# Patient Record
Sex: Male | Born: 1960 | Race: Black or African American | Hispanic: No | Smoking: Never smoker
Health system: Southern US, Community
[De-identification: ages and names within clinical notes are randomized; demographics above are authoritative.]

## PROBLEM LIST (undated history)

## (undated) DIAGNOSIS — I10 Essential (primary) hypertension: Secondary | ICD-10-CM

---

## 2000-12-02 ENCOUNTER — Encounter: Payer: Self-pay | Admitting: Pulmonary Disease

## 2000-12-02 ENCOUNTER — Ambulatory Visit (HOSPITAL_COMMUNITY): Admission: RE | Admit: 2000-12-02 | Discharge: 2000-12-02 | Payer: Self-pay | Admitting: Pulmonary Disease

## 2001-05-18 ENCOUNTER — Emergency Department (HOSPITAL_COMMUNITY): Admission: EM | Admit: 2001-05-18 | Discharge: 2001-05-18 | Payer: Self-pay | Admitting: Emergency Medicine

## 2001-05-27 ENCOUNTER — Encounter: Admission: RE | Admit: 2001-05-27 | Discharge: 2001-06-13 | Payer: Self-pay | Admitting: Family Medicine

## 2012-09-26 ENCOUNTER — Emergency Department (HOSPITAL_COMMUNITY)
Admission: EM | Admit: 2012-09-26 | Discharge: 2012-09-26 | Disposition: A | Discharge status: Home / Self Care | Payer: BC Managed Care – PPO | Attending: Emergency Medicine | Admitting: Emergency Medicine

## 2012-09-26 ENCOUNTER — Encounter (HOSPITAL_COMMUNITY): Payer: Self-pay | Admitting: Emergency Medicine

## 2012-09-26 ENCOUNTER — Emergency Department (HOSPITAL_COMMUNITY): Payer: BC Managed Care – PPO

## 2012-09-26 DIAGNOSIS — Y9389 Activity, other specified: Secondary | ICD-10-CM | POA: Insufficient documentation

## 2012-09-26 DIAGNOSIS — Y9289 Other specified places as the place of occurrence of the external cause: Secondary | ICD-10-CM | POA: Insufficient documentation

## 2012-09-26 DIAGNOSIS — I10 Essential (primary) hypertension: Secondary | ICD-10-CM | POA: Insufficient documentation

## 2012-09-26 DIAGNOSIS — S20219A Contusion of unspecified front wall of thorax, initial encounter: Secondary | ICD-10-CM | POA: Insufficient documentation

## 2012-09-26 HISTORY — DX: Essential (primary) hypertension: I10

## 2012-09-26 MED ORDER — HYDROCODONE-ACETAMINOPHEN 5-325 MG PO TABS: 2.0000 | ORAL_TABLET | ORAL | Status: AC | PRN

## 2012-09-26 MED ORDER — HYDROCODONE-ACETAMINOPHEN 5-325 MG PO TABS
2.0000 | ORAL_TABLET | Freq: Once | ORAL | Status: AC
  Administered 2012-09-26: 2 via ORAL
  Filled 2012-09-26: qty 2

## 2012-09-26 NOTE — ED Provider Notes (Signed)
Medical screening examination/treatment/procedure(s) were performed by non-physician practitioner and as supervising physician I was immediately available for consultation/collaboration.  Leslee Home, M.D.   Reuben Likes, MD 09/26/12 1136

## 2012-09-26 NOTE — ED Notes (Signed)
Pt returned from xray

## 2012-09-26 NOTE — ED Notes (Addendum)
Pt c/o rt rib area pain, and lower rt back and hip pain. Pt also has hx of htn, did not take medication this morning.
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
## 2012-09-26 NOTE — ED Notes (Signed)
Patient transported to X-ray 

## 2012-09-26 NOTE — ED Notes (Signed)
Discharge instructions reviewed. Pt verbalized understanding.  
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
## 2012-09-26 NOTE — ED Notes (Signed)
Pt transported to xray via stretcher

## 2014-02-25 ENCOUNTER — Encounter (HOSPITAL_COMMUNITY): Payer: Self-pay | Admitting: Emergency Medicine

## 2014-02-25 ENCOUNTER — Emergency Department (HOSPITAL_COMMUNITY)
Admission: EM | Admit: 2014-02-25 | Discharge: 2014-02-25 | Disposition: A | Discharge status: Home / Self Care | Payer: BC Managed Care – PPO | Source: Home / Self Care | Attending: Family Medicine | Admitting: Family Medicine

## 2014-02-25 DIAGNOSIS — N182 Chronic kidney disease, stage 2 (mild): Secondary | ICD-10-CM

## 2014-02-25 DIAGNOSIS — I10 Essential (primary) hypertension: Secondary | ICD-10-CM

## 2014-02-25 DIAGNOSIS — J40 Bronchitis, not specified as acute or chronic: Secondary | ICD-10-CM

## 2014-02-25 LAB — POCT I-STAT, CHEM 8
BUN: 12 mg/dL (ref 6–23)
CALCIUM ION: 1.16 mmol/L (ref 1.12–1.23)
CHLORIDE: 105 meq/L (ref 96–112)
Creatinine, Ser: 1.4 mg/dL — ABNORMAL HIGH (ref 0.50–1.35)
Glucose, Bld: 108 mg/dL — ABNORMAL HIGH (ref 70–99)
HCT: 45 % (ref 39.0–52.0)
Hemoglobin: 15.3 g/dL (ref 13.0–17.0)
Potassium: 3.8 mEq/L (ref 3.7–5.3)
Sodium: 142 mEq/L (ref 137–147)
TCO2: 26 mmol/L (ref 0–100)

## 2014-02-25 MED ORDER — FLUTICASONE PROPIONATE 50 MCG/ACT NA SUSP: 2.0000 | Freq: Every day | NASAL | Status: AC

## 2014-02-25 MED ORDER — IPRATROPIUM-ALBUTEROL 0.5-2.5 (3) MG/3ML IN SOLN
3.0000 mL | Freq: Once | RESPIRATORY_TRACT | Status: AC
  Administered 2014-02-25: 3 mL via RESPIRATORY_TRACT

## 2014-02-25 MED ORDER — ALBUTEROL SULFATE HFA 108 (90 BASE) MCG/ACT IN AERS: 2.0000 | INHALATION_SPRAY | Freq: Four times a day (QID) | RESPIRATORY_TRACT | Status: AC | PRN

## 2014-02-25 MED ORDER — IPRATROPIUM-ALBUTEROL 0.5-2.5 (3) MG/3ML IN SOLN
RESPIRATORY_TRACT | Status: AC
  Filled 2014-02-25: qty 3

## 2014-02-25 MED ORDER — PREDNISONE 10 MG PO TABS: 30.0000 mg | ORAL_TABLET | Freq: Every day | ORAL | Status: DC

## 2014-02-25 MED ORDER — OLMESARTAN MEDOXOMIL-HCTZ 20-12.5 MG PO TABS: 1.0000 | ORAL_TABLET | Freq: Every day | ORAL | Status: DC

## 2014-02-25 NOTE — Discharge Instructions (Signed)
Thank you for coming in today. Take the blood pressure medicine.  Follow up with your doctor in 2-3 weeks for kidney function test.  Your creatine is 1.4 (normal is around 1).  Take prednisone daily for 5 days.  Use over the counter zyrtec.  Use flonase nasal spray daily during allergy season.  Use albuterol inhaler as needed.  Call or go to the emergency room if you get worse, have trouble breathing, have chest pains, or palpitations. '   Bronchitis Bronchitis is inflammation of the airways that extend from the windpipe into the lungs (bronchi). The inflammation often causes mucus to develop, which leads to a cough. If the inflammation becomes severe, it may cause shortness of breath. CAUSES  Bronchitis may be caused by:   Viral infections.   Bacteria.   Cigarette smoke.   Allergens, pollutants, and other irritants.  SIGNS AND SYMPTOMS  The most common symptom of bronchitis is a frequent cough that produces mucus. Other symptoms include:  Fever.   Body aches.   Chest congestion.   Chills.   Shortness of breath.   Sore throat.  DIAGNOSIS  Bronchitis is usually diagnosed through a medical history and physical exam. Tests, such as chest X-rays, are sometimes done to rule out other conditions.  TREATMENT  You may need to avoid contact with whatever caused the problem (smoking, for example). Medicines are sometimes needed. These may include:  Antibiotics. These may be prescribed if the condition is caused by bacteria.  Cough suppressants. These may be prescribed for relief of cough symptoms.   Inhaled medicines. These may be prescribed to help open your airways and make it easier for you to breathe.   Steroid medicines. These may be prescribed for those with recurrent (chronic) bronchitis. HOME CARE INSTRUCTIONS  Get plenty of rest.   Drink enough fluids to keep your urine clear or pale yellow (unless you have a medical condition that requires fluid  restriction). Increasing fluids may help thin your secretions and will prevent dehydration.   Only take over-the-counter or prescription medicines as directed by your health care provider.  Only take antibiotics as directed. Make sure you finish them even if you start to feel better.  Avoid secondhand smoke, irritating chemicals, and strong fumes. These will make bronchitis worse. If you are a smoker, quit smoking. Consider using nicotine gum or skin patches to help control withdrawal symptoms. Quitting smoking will help your lungs heal faster.   Put a cool-mist humidifier in your bedroom at night to moisten the air. This may help loosen mucus. Change the water in the humidifier daily. You can also run the hot water in your shower and sit in the bathroom with the door closed for 5 10 minutes.   Follow up with your health care provider as directed.   Wash your hands frequently to avoid catching bronchitis again or spreading an infection to others.  SEEK MEDICAL CARE IF: Your symptoms do not improve after 1 week of treatment.  SEEK IMMEDIATE MEDICAL CARE IF:  Your fever increases.  You have chills.   You have chest pain.   You have worsening shortness of breath.   You have bloody sputum.  You faint.  You have lightheadedness.  You have a severe headache.   You vomit repeatedly. MAKE SURE YOU:   Understand these instructions.  Will watch your condition.  Will get help right away if you are not doing well or get worse. Document Released: 09/24/2005 Document Revised: 07/15/2013 Document Reviewed:  05/19/2013 ExitCare Patient Information 2014 [place], [place].   Arterial Hypertension Arterial hypertension (high blood pressure) is a condition of elevated pressure in your blood vessels. Hypertension over a long period of time is a risk factor for strokes, heart attacks, and heart failure. It is also the leading cause of kidney (renal) failure.  CAUSES   In Adults --  Over 90% of all hypertension has no known cause. This is called essential or primary hypertension. In the other 10% of people with hypertension, the increase in blood pressure is caused by another disorder. This is called secondary hypertension. Important causes of secondary hypertension are:  Heavy alcohol use.  Obstructive sleep apnea.  Hyperaldosterosim (Conn's syndrome).  Steroid use.  Chronic kidney failure.  Hyperparathyroidism.  Medications.  Renal artery stenosis.  Pheochromocytoma.  Cushing's disease.  Coarctation of the aorta.  Scleroderma renal crisis.  Licorice (in excessive amounts).  Drugs (cocaine, methamphetamine). Your caregiver can explain any items above that apply to you.  In Children -- Secondary hypertension is more common and should always be considered.  Pregnancy -- Few women of childbearing age have high blood pressure. However, up to 10% of them develop hypertension of pregnancy. Generally, this will not harm the woman. It may be a sign of 3 complications of pregnancy: preeclampsia, HELLP syndrome, and eclampsia. Follow up and control with medication is necessary. SYMPTOMS   This condition normally does not produce any noticeable symptoms. It is usually found during a routine exam.  Malignant hypertension is a late problem of high blood pressure. It may have the following symptoms:  Headaches.  Blurred vision.  End-organ damage (this means your kidneys, heart, lungs, and other organs are being damaged).  Stressful situations can increase the blood pressure. If a person with normal blood pressure has their blood pressure go up while being seen by their caregiver, this is often termed "white coat hypertension." Its importance is not known. It may be related with eventually developing hypertension or complications of hypertension.  Hypertension is often confused with mental tension, stress, and anxiety. DIAGNOSIS  The diagnosis is made by 3  separate blood pressure measurements. They are taken at least 1 week apart from each other. If there is organ damage from hypertension, the diagnosis may be made without repeat measurements. Hypertension is usually identified by having blood pressure readings:  Above 140/90 mmHg measured in both arms, at 3 separate times, over a couple weeks.  Over 130/80 mmHg should be considered a risk factor and may require treatment in patients with diabetes. Blood pressure readings over 120/80 mmHg are called "pre-hypertension" even in non-diabetic patients. To get a true blood pressure measurement, use the following guidelines. Be aware of the factors that can alter blood pressure readings.  Take measurements at least 1 hour after caffeine.  Take measurements 30 minutes after smoking and without any stress. This is another reason to quit smoking  it raises your blood pressure.  Use a proper cuff size. Ask your caregiver if you are not sure about your cuff size.  Most home blood pressure cuffs are automatic. They will measure systolic and diastolic pressures. The systolic pressure is the pressure reading at the start of sounds. Diastolic pressure is the pressure at which the sounds disappear. If you are elderly, measure pressures in multiple postures. Try sitting, lying or standing.  Sit at rest for a minimum of 5 minutes before taking measurements.  You should not be on any medications like decongestants. These are found in many  cold medications.  Record your blood pressure readings and review them with your caregiver. If you have hypertension:  Your caregiver may do tests to be sure you do not have secondary hypertension (see "causes" above).  Your caregiver may also look for signs of metabolic syndrome. This is also called Syndrome X or Insulin Resistance Syndrome. You may have this syndrome if you have type 2 diabetes, abdominal obesity, and abnormal blood lipids in addition to  hypertension.  Your caregiver will take your medical and family history and perform a physical exam.  Diagnostic tests may include blood tests (for glucose, cholesterol, potassium, and kidney function), a urinalysis, or an EKG. Other tests may also be necessary depending on your condition. PREVENTION  There are important lifestyle issues that you can adopt to reduce your chance of developing hypertension:  Maintain a normal weight.  Limit the amount of salt (sodium) in your diet.  Exercise often.  Limit alcohol intake.  Get enough potassium in your diet. Discuss specific advice with your caregiver.  Follow a DASH diet (dietary approaches to stop hypertension). This diet is rich in fruits, vegetables, and low-fat dairy products, and avoids certain fats. PROGNOSIS  Essential hypertension cannot be cured. Lifestyle changes and medical treatment can lower blood pressure and reduce complications. The prognosis of secondary hypertension depends on the underlying cause. Many people whose hypertension is controlled with medicine or lifestyle changes can live a normal, healthy life.  RISKS AND COMPLICATIONS  While high blood pressure alone is not an illness, it often requires treatment due to its short- and long-term effects on many organs. Hypertension increases your risk for:  CVAs or strokes (cerebrovascular accident).  Heart failure due to chronically high blood pressure (hypertensive cardiomyopathy).  Heart attack (myocardial infarction).  Damage to the retina (hypertensive retinopathy).  Kidney failure (hypertensive nephropathy). Your caregiver can explain list items above that apply to you. Treatment of hypertension can significantly reduce the risk of complications. TREATMENT   For overweight patients, weight loss and regular exercise are recommended. Physical fitness lowers blood pressure.  Mild hypertension is usually treated with diet and exercise. A diet rich in fruits and  vegetables, fat-free dairy products, and foods low in fat and salt (sodium) can help lower blood pressure. Decreasing salt intake decreases blood pressure in a 1/3 of people.  Stop smoking if you are a smoker. The steps above are highly effective in reducing blood pressure. While these actions are easy to suggest, they are difficult to achieve. Most patients with moderate or severe hypertension end up requiring medications to bring their blood pressure down to a normal level. There are several classes of medications for treatment. Blood pressure pills (antihypertensives) will lower blood pressure by their different actions. Lowering the blood pressure by 10 mmHg may decrease the risk of complications by as much as 25%. The goal of treatment is effective blood pressure control. This will reduce your risk for complications. Your caregiver will help you determine the best treatment for you according to your lifestyle. What is excellent treatment for one person, may not be for you. HOME CARE INSTRUCTIONS   Do not smoke.  Follow the lifestyle changes outlined in the "Prevention" section.  If you are on medications, follow the directions carefully. Blood pressure medications must be taken as prescribed. Skipping doses reduces their benefit. It also puts you at risk for problems.  Follow up with your caregiver, as directed.  If you are asked to monitor your blood pressure at home, follow  the guidelines in the "Diagnosis" section above. SEEK MEDICAL CARE IF:   You think you are having medication side effects.  You have recurrent headaches or lightheadedness.  You have swelling in your ankles.  You have trouble with your vision. SEEK IMMEDIATE MEDICAL CARE IF:   You have sudden onset of chest pain or pressure, difficulty breathing, or other symptoms of a heart attack.  You have a severe headache.  You have symptoms of a stroke (such as sudden weakness, difficulty speaking, difficulty  walking). MAKE SURE YOU:   Understand these instructions.  Will watch your condition.  Will get help right away if you are not doing well or get worse. Document Released: 09/24/2005 Document Revised: 12/17/2011 Document Reviewed: 04/24/2007 [facility] Patient Information 2014 [place], [place].

## 2014-02-25 NOTE — ED Provider Notes (Signed)
Malik Gonzalez is a 53 y.o. male who presents to Urgent Care today for cough. Patient has one to 2 days of productive cough no associated with wheezing and mild shortness of breath and chest tightness. He additionally has a runny nose. Has tried cold which helps a bit. He works outside is been exposed to allergies.  Additionally he has hypertension. He has run out of his blood pressure medications recently. He denies any current chest pains or palpitations dizziness lightheadedness or weakness.   Past Medical History  Diagnosis Date  . Hypertension    History  Substance Use Topics  . Smoking status: Never Smoker   . Smokeless tobacco: Not on file  . Alcohol Use: No   ROS as above Medications: No current facility-administered medications for this encounter.   Current Outpatient Prescriptions  Medication Sig Dispense Refill  . acetaminophen (TYLENOL) 500 MG tablet Take 1,000 mg by mouth every 6 (six) hours as needed. For pain      . albuterol (PROVENTIL HFA;VENTOLIN HFA) 108 (90 BASE) MCG/ACT inhaler Inhale 2 puffs into the lungs every 6 (six) hours as needed for wheezing or shortness of breath.  1 Inhaler  2  . fluticasone (FLONASE) 50 MCG/ACT nasal spray Place 2 sprays into both nostrils daily.  16 g  2  . Multiple Vitamin (MULTIVITAMIN WITH MINERALS) TABS Take 1 tablet by mouth daily.      Marland Kitchen. olmesartan-hydrochlorothiazide (BENICAR HCT) 20-12.5 MG per tablet Take 1 tablet by mouth daily.  30 tablet  0  . predniSONE (DELTASONE) 10 MG tablet Take 3 tablets (30 mg total) by mouth daily.  15 tablet  0    Exam:  BP 153/100  Pulse 68  Temp(Src) 98.3 F (36.8 C) (Oral)  Resp 18  SpO2 100% Gen: Well NAD HEENT: EOMI,  MMM posterior pharynx cobblestoning. Normal tympanic membranes bilaterally. Clear nasal discharge present. Lungs: Normal work of breathing. Wheezing present bilaterally. Heart: RRR no MRG Abd: NABS, Soft. NT, ND Exts: Brisk capillary refill, warm and well perfused.    She was given a DuoNeb nebulizer treatment, and felt much better  Results for orders placed during the hospital encounter of 02/25/14 (from the past 24 hour(s))  POCT I-STAT, CHEM 8     Status: Abnormal   Collection Time    02/25/14  9:12 AM      Result Value Ref Range   Sodium 142  137 - 147 mEq/L   Potassium 3.8  3.7 - 5.3 mEq/L   Chloride 105  96 - 112 mEq/L   BUN 12  6 - 23 mg/dL   Creatinine, Ser 6.961.40 (*) 0.50 - 1.35 mg/dL   Glucose, Bld 295108 (*) 70 - 99 mg/dL   Calcium, Ion 2.841.16  1.321.12 - 1.23 mmol/L   TCO2 26  0 - 100 mmol/L   Hemoglobin 15.3  13.0 - 17.0 g/dL   HCT 44.045.0  10.239.0 - 72.552.0 %   No results found.  Assessment and Plan: 53 y.o. male with  1) allergic bronchitis with sinusitis. Plan to treat with prednisone albuterol Flonase and Zyrtec. 2) hypertension: Plan to refill existing Benicar.  3) elevated creatinine: Mild likely chronic. Recommend patient followup with his primary care provider in 2-3 weeks for recheck of creatinine.  Discussed warning signs or symptoms. Please see discharge instructions. Patient expresses understanding.    Rodolph BongEvan S Thaer Miyoshi, MD 02/25/14 [phone]

## 2014-02-25 NOTE — ED Notes (Signed)
C/o uri which started yesterday States he has a cough and sob Tried halls as tx States he does have seasonal allergies

## 2014-03-05 ENCOUNTER — Ambulatory Visit (HOSPITAL_COMMUNITY)
Admission: RE | Admit: 2014-03-05 | Discharge: 2014-03-05 | Disposition: A | Discharge status: Home / Self Care | Payer: BC Managed Care – PPO | Source: Ambulatory Visit | Attending: Pulmonary Disease | Admitting: Pulmonary Disease

## 2014-03-05 ENCOUNTER — Other Ambulatory Visit (HOSPITAL_COMMUNITY): Payer: Self-pay | Admitting: Pulmonary Disease

## 2014-03-05 DIAGNOSIS — R05 Cough: Secondary | ICD-10-CM | POA: Insufficient documentation

## 2014-03-05 DIAGNOSIS — R059 Cough, unspecified: Secondary | ICD-10-CM

## 2014-03-05 DIAGNOSIS — J40 Bronchitis, not specified as acute or chronic: Secondary | ICD-10-CM | POA: Diagnosis not present

## 2014-03-05 DIAGNOSIS — J3489 Other specified disorders of nose and nasal sinuses: Secondary | ICD-10-CM | POA: Diagnosis not present

## 2019-06-29 ENCOUNTER — Other Ambulatory Visit: Payer: Self-pay | Admitting: Family Medicine

## 2019-06-29 DIAGNOSIS — N410 Acute prostatitis: Secondary | ICD-10-CM

## 2019-06-29 DIAGNOSIS — N5089 Other specified disorders of the male genital organs: Secondary | ICD-10-CM

## 2019-07-06 ENCOUNTER — Ambulatory Visit
Admission: RE | Admit: 2019-07-06 | Discharge: 2019-07-06 | Disposition: A | Discharge status: Home / Self Care | Payer: BC Managed Care – PPO | Source: Ambulatory Visit | Attending: Family Medicine | Admitting: Family Medicine

## 2019-07-06 DIAGNOSIS — N410 Acute prostatitis: Secondary | ICD-10-CM

## 2019-07-06 DIAGNOSIS — N5089 Other specified disorders of the male genital organs: Secondary | ICD-10-CM

## 2021-03-30 ENCOUNTER — Encounter: Payer: Self-pay | Admitting: Emergency Medicine

## 2021-03-30 ENCOUNTER — Emergency Department
Admission: EM | Admit: 2021-03-30 | Discharge: 2021-03-30 | Disposition: A | Discharge status: Home / Self Care | Payer: BC Managed Care – PPO | Attending: Emergency Medicine | Admitting: Emergency Medicine

## 2021-03-30 ENCOUNTER — Emergency Department: Payer: BC Managed Care – PPO

## 2021-03-30 ENCOUNTER — Other Ambulatory Visit: Payer: Self-pay

## 2021-03-30 DIAGNOSIS — R079 Chest pain, unspecified: Secondary | ICD-10-CM

## 2021-03-30 DIAGNOSIS — R0789 Other chest pain: Secondary | ICD-10-CM | POA: Insufficient documentation

## 2021-03-30 DIAGNOSIS — Z20822 Contact with and (suspected) exposure to covid-19: Secondary | ICD-10-CM | POA: Insufficient documentation

## 2021-03-30 DIAGNOSIS — I10 Essential (primary) hypertension: Secondary | ICD-10-CM | POA: Diagnosis not present

## 2021-03-30 DIAGNOSIS — Z79899 Other long term (current) drug therapy: Secondary | ICD-10-CM | POA: Diagnosis not present

## 2021-03-30 DIAGNOSIS — R202 Paresthesia of skin: Secondary | ICD-10-CM | POA: Insufficient documentation

## 2021-03-30 LAB — COMPREHENSIVE METABOLIC PANEL
ALT: 26 U/L (ref 0–44)
AST: 26 U/L (ref 15–41)
Albumin: 4 g/dL (ref 3.5–5.0)
Alkaline Phosphatase: 75 U/L (ref 38–126)
Anion gap: 5 (ref 5–15)
BUN: 20 mg/dL (ref 6–20)
CO2: 28 mmol/L (ref 22–32)
Calcium: 9 mg/dL (ref 8.9–10.3)
Chloride: 103 mmol/L (ref 98–111)
Creatinine, Ser: 1.33 mg/dL — ABNORMAL HIGH (ref 0.61–1.24)
GFR, Estimated: >60 mL/min (ref >60)
Glucose, Bld: 104 mg/dL — ABNORMAL HIGH (ref 70–99)
Potassium: 3.7 mmol/L (ref 3.5–5.1)
Sodium: 136 mmol/L (ref 135–145)
Total Bilirubin: 1.9 mg/dL — ABNORMAL HIGH (ref 0.3–1.2)
Total Protein: 7.5 g/dL (ref 6.5–8.1)

## 2021-03-30 LAB — CBC
HCT: 41.3 % (ref 39.0–52.0)
Hemoglobin: 13.7 g/dL (ref 13.0–17.0)
MCH: 29.2 pg (ref 26.0–34.0)
MCHC: 33.2 g/dL (ref 30.0–36.0)
MCV: 88.1 fL (ref 80.0–100.0)
Platelets: 196 10*3/uL (ref 150–400)
RBC: 4.69 MIL/uL (ref 4.22–5.81)
RDW: 12.8 % (ref 11.5–15.5)
WBC: 8.3 10*3/uL (ref 4.0–10.5)
nRBC: 0 % (ref 0.0–0.2)

## 2021-03-30 LAB — TROPONIN I (HIGH SENSITIVITY)
Troponin I (High Sensitivity): 5 ng/L (ref <18)
Troponin I (High Sensitivity): 5 ng/L (ref <18)

## 2021-03-30 LAB — RESP PANEL BY RT-PCR (FLU A&B, COVID) ARPGX2
Influenza A by PCR: NEGATIVE
Influenza B by PCR: NEGATIVE
SARS Coronavirus 2 by RT PCR: NEGATIVE

## 2021-03-30 MED ORDER — HYDROCHLOROTHIAZIDE 12.5 MG PO CAPS: 12.5000 mg | ORAL_CAPSULE | Freq: Every day | ORAL | 0 refills | Status: DC

## 2021-03-30 MED ORDER — NITROGLYCERIN 2 % TD OINT
1.0000 [in_us] | TOPICAL_OINTMENT | Freq: Once | TRANSDERMAL | Status: AC
  Administered 2021-03-30: 1 [in_us] via TOPICAL
  Filled 2021-03-30: qty 1

## 2021-03-30 MED ORDER — ACETAMINOPHEN 500 MG PO TABS
1000.0000 mg | ORAL_TABLET | Freq: Once | ORAL | Status: AC
  Administered 2021-03-30: 1000 mg via ORAL
  Filled 2021-03-30: qty 2

## 2021-03-30 NOTE — ED Triage Notes (Signed)
Patient arrives via [facility] from home for CP that started 2 days ago. Patient states it feels like pressure with numbness radiating into left arm. Patient Aox4 at this time.

## 2021-03-30 NOTE — Discharge Instructions (Addendum)
Please call and schedule a follow-up appointment with both cardiology and primary care.  Take the additional hydrochlorothiazide in addition to the medication you are already on.  Return to the emergency department for symptoms change or worsen if you are unable to schedule an appointment.

## 2021-03-30 NOTE — ED Provider Notes (Signed)
[facility] Emergency Department Provider Note  ____________________________________________   Event Date/Time   First MD Initiated Contact with Patient 03/30/21 1324     (approximate)  I have reviewed the triage vital signs and the nursing notes.   HISTORY  Chief Complaint Chest Pain   HPI Malik Gonzalez is a 60 y.o. male with a history of hypertension presents to the emergency department for treatment and evaluation of chest pain with left arm tingling that was intermittent yesterday, then became consistent yesterday evening. Symptoms have continued through the day. He was hypertensive enroute. EMS gave 1 NTG with reduction in blood pressure and chest pain.  HEART score 3.  Past Medical History:  Diagnosis Date   Hypertension     There are no problems to display for this patient.   History reviewed. No pertinent surgical history.  Prior to Admission medications   Medication Sig Start Date End Date Taking? Authorizing Provider  acetaminophen (TYLENOL) 500 MG tablet Take 1,000 mg by mouth every 6 (six) hours as needed. For pain    [provider]  albuterol (PROVENTIL HFA;VENTOLIN HFA) 108 (90 BASE) MCG/ACT inhaler Inhale 2 puffs into the lungs every 6 (six) hours as needed for wheezing or shortness of breath. 02/25/14   Rodolph Bong, MD  fluticasone (FLONASE) 50 MCG/ACT nasal spray Place 2 sprays into both nostrils daily. 02/25/14   Rodolph Bong, MD  Multiple Vitamin (MULTIVITAMIN WITH MINERALS) TABS Take 1 tablet by mouth daily.    [provider]  olmesartan-hydrochlorothiazide (BENICAR HCT) 20-12.5 MG per tablet Take 1 tablet by mouth daily. 02/25/14   Rodolph Bong, MD  predniSONE (DELTASONE) 10 MG tablet Take 3 tablets (30 mg total) by mouth daily. 02/25/14   Rodolph Bong, MD    Allergies Aspirin  No family history on file.  Social History Social History   Tobacco Use   Smoking status: Never  Substance Use Topics    Alcohol use: No   Drug use: No    Review of Systems  Constitutional: No fever/chills. Eyes: No visual changes. ENT: No sore throat. Cardiovascular: Positive for chest pain/pressure. Negative for pleuritic pain. Negative for palpitations. Negative for leg pain. Respiratory: Negative shortness of breath. Gastrointestinal: Negative for abdominal pain. no nausea, no vomiting.  No diarrhea.  No constipation. Genitourinary: Negative for dysuria. Musculoskeletal: Negative for for back pain.  Skin: Negative for rash, lesion, wound. Neurological: Negative for headaches, focal weakness or numbness.  ____________________________________________   PHYSICAL EXAM:  Today's Vitals   03/30/21 1400 03/30/21 1430 03/30/21 1530 03/30/21 1541  BP: (!) 138/96 (!) 138/100 (!) 138/100   Pulse: (!) 57 (!) 56 (!) 59   Resp: 13 16 16    Temp:      TempSrc:      SpO2: 98% 99% 98%   Weight:      Height:      PainSc:    4    Body mass index is 34.44 kg/m.    Constitutional: Alert and oriented. Well appearing and in no acute distress. Normal mental status. Eyes: Conjunctivae are normal. PERRL. Head: Atraumatic. Nose: No congestion/rhinnorhea. Mouth/Throat: Mucous membranes are moist.  Oropharynx non-erythematous. Tongue normal in size and color. Neck: No stridor. No carotid bruit appreciated on exam. Hematological/Lymphatic/Immunilogical: No cervical lymphadenopathy. Cardiovascular: Normal rate, regular rhythm. Grossly normal heart sounds.  Good peripheral circulation. Respiratory: Normal respiratory effort.  No retractions. Lungs CTAB. Gastrointestinal: Soft and nontender. No distention. No abdominal bruits. No  CVA tenderness. Genitourinary: Exam deferred. Musculoskeletal: No lower extremity tenderness. No edema of extremities. Neurologic:  Normal speech and language. No gross focal neurologic deficits are appreciated. Skin:  Skin is warm, dry and intact. No rash noted. Psychiatric: Mood and  affect are normal. Speech and behavior are normal.  ____________________________________________   LABS (all labs ordered are listed, but only abnormal results are displayed)  Labs Reviewed  COMPREHENSIVE METABOLIC PANEL - Abnormal; Notable for the following components:      Result Value   Glucose, Bld 104 (*)    Creatinine, Ser 1.33 (*)    Total Bilirubin 1.9 (*)    All other components within normal limits  RESP PANEL BY RT-PCR (FLU A&B, COVID) ARPGX2  CBC  TROPONIN I (HIGH SENSITIVITY)  TROPONIN I (HIGH SENSITIVITY)   ____________________________________________  EKG  ED ECG REPORT I, Mays Paino, FNP-BC personally viewed and interpreted this ECG.   Date: 03/30/2021  EKG Time: 1330  Rate: 62  Rhythm: normal EKG, normal sinus rhythm  Axis: normal  Intervals:none  ST&T Change: no ST elevation  ____________________________________________  RADIOLOGY  ED MD interpretation:  Chest x-ray without acute findings.  I, Kem Boroughs, personally viewed and evaluated these images (plain radiographs) as part of my medical decision making, as well as reviewing the written report by the radiologist.  Official radiology report(s): DG Chest Port 1 View  Result Date: 03/30/2021 CLINICAL DATA:  Chest pain for 2 days EXAM: PORTABLE CHEST 1 VIEW COMPARISON:  03/05/2014 FINDINGS: The heart size and mediastinal contours are within normal limits. Tortuosity of the thoracic aorta. No focal airspace consolidation, pleural effusion, or pneumothorax. Chronic left third rib fracture. IMPRESSION: No active disease. Electronically Signed   By: Duanne Guess D.O.   On: 03/30/2021 14:43    ____________________________________________   PROCEDURES  Procedure(s) performed: None  Procedures  Critical Care performed: No  ____________________________________________   INITIAL IMPRESSION / ASSESSMENT AND PLAN   60 year old male presenting to the emergency department for treatment and  evaluation of chest pressure and left arm tingling.  See HPI for further details.   Differential diagnosis includes, but not limited to:  Differential includes, but is not limited to, viral syndrome, bronchitis including COPD exacerbation, reactive airway disease including asthma, CHF including exacerbation with or without pulmonary/interstitial edema, pneumothorax, ACS, thoracic trauma, and pulmonary embolism, ACS, aortic dissection, pulmonary embolism, cardiac tamponade, pneumothorax, pneumonia, pericarditis, myocarditis, GI-related causes including esophagitis/gastritis, and musculoskeletal chest wall pain.    ED COURSE  ----------------------------------------- 2:52 PM on 03/30/2021 ----------------------------------------- Initial troponin is normal. Chest x-ray without acute concerns. Awaiting COVID and influenza test. He continues to rate pain 5/10 and now has a headache. BP elevated. Will order nitro paste and tylenol. He has an allergy to ASA.   ----------------------------------------- 3:43 PM on 03/30/2021 ----------------------------------------- Chest tightness now "about gone" and headache is "much better." Main complaint at this time is left arm tingling. Neuro exam normal. No pronator drift, no focal weakness, tongue protrudes midline, equal smile, lower extremities 5/5. Remains hypertensive.   ----------------------------------------- 4:51PM on 03/30/2021 ----------------------------------------- Second troponin negative. Patient and wife believe that he may have strained his arm/neck lifting their elderly dog up and down and in/out of his crate, which may be the underlying cause of the arm tingling.   Patient stable for discharge. He is to increase his HCTZ to 25mg  and continue the ACE. He is to follow up with primary care as well as schedule an appointment with cardiology. For any concerns, he  is to return to the ER if unable to get in with PCP or cardiology.  Clinical  Course as of 03/30/21 1836  Thu Mar 30, 2021  1617 Troponin I (High Sensitivity) [CT]    Clinical Course User Index [CT] Chinita Pester, FNP    FINAL CLINICAL IMPRESSION(S) / ED DIAGNOSES  Final diagnoses:  None     ED Discharge Orders     None        Malik Gonzalez was evaluated in Emergency Department on 03/30/2021 for the symptoms described in the history of present illness. He was evaluated in the context of the global COVID-19 pandemic, which necessitated consideration that the patient might be at risk for infection with the SARS-CoV-2 virus that causes COVID-19. Institutional protocols and algorithms that pertain to the evaluation of patients at risk for COVID-19 are in a state of rapid change based on information released by regulatory bodies including the CDC and federal and state organizations. These policies and algorithms were followed during the patient's care in the ED.   Note:  This document was prepared using Dragon voice recognition software and may include unintentional dictation errors.    Chinita Pester, FNP 03/30/21 Abram Sander, MD 04/05/21 [phone]

## 2021-05-08 ENCOUNTER — Other Ambulatory Visit: Payer: Self-pay

## 2021-05-08 ENCOUNTER — Ambulatory Visit: Payer: BC Managed Care – PPO | Admitting: Cardiology

## 2021-05-08 ENCOUNTER — Encounter: Payer: Self-pay | Admitting: Cardiology

## 2021-05-08 VITALS — BP 144/90 | HR 56 | Ht 69.5 in | Wt 252.0 lb

## 2021-05-08 DIAGNOSIS — Z13220 Encounter for screening for lipoid disorders: Secondary | ICD-10-CM | POA: Diagnosis not present

## 2021-05-08 DIAGNOSIS — I10 Essential (primary) hypertension: Secondary | ICD-10-CM

## 2021-05-08 DIAGNOSIS — R072 Precordial pain: Secondary | ICD-10-CM

## 2021-05-08 MED ORDER — METOPROLOL TARTRATE 50 MG PO TABS: ORAL_TABLET | ORAL | 0 refills | Status: DC
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
## 2021-05-08 NOTE — Patient Instructions (Addendum)
Medication Instructions:  - Your physician recommends that you continue on your current medications as directed. Please refer to the Current Medication list given to you today.  *If you need a refill on your cardiac medications before your next appointment, please call your pharmacy*   Lab Work: - Your physician recommends that you have lab work today: BMP/ Lipid  If you have labs (blood work) drawn today and your tests are completely normal, you will receive your results only by: MyChart Message (if you have MyChart) OR A paper copy in the mail If you have any lab test that is abnormal or we need to change your treatment, we will call you to review the results.   Testing/Procedures:  1) Echocardiogram:  - Your physician has requested that you have an echocardiogram. Echocardiography is a painless test that uses sound waves to create images of your heart. It provides your doctor with information about the size and shape of your heart and how well your heart's chambers and valves are working. This procedure takes approximately one hour. There are no restrictions for this procedure. There is a possibility that an IV may need to be started during your test to inject an image enhancing agent. This is done to obtain more optimal pictures of your heart. Therefore we ask that you do at least drink some water prior to coming in to hydrate your veins.     2) Cardiac CT angiogram:  - Your physician has requested that you have cardiac CT. Cardiac computed tomography (CT) is a painless test that uses an x-ray machine to take clear, detailed pictures of your heart.       Your cardiac CT will be scheduled at one of the below locations:   [facility] [address] [place], [place] [address] [phone]  OR  [facility] [address] [place], [place] [address] [phone]  If scheduled at [facility], please arrive at  the [facility] main entrance (entrance A) of [facility] 30 minutes prior to test start time. Proceed to the [facility] Radiology Department (first floor) to check-in and test prep.  If scheduled at [facility], please arrive 15 mins early for check-in and test prep.  Please follow these instructions carefully (unless otherwise directed):  Hold all erectile dysfunction medications at least 3 days (72 hrs) prior to test.  On the Night Before the Test: Be sure to Drink plenty of water. Do not consume any caffeinated/decaffeinated beverages or chocolate 12 hours prior to your test. Do not take any antihistamines 12 hours prior to your test.   On the Day of the Test: Drink plenty of water until 1 hour prior to the test. Do not eat any food 4 hours prior to the test. You may take your regular medications prior to the test.  Take metoprolol (Lopressor) 50 mg two hours prior to test. HOLD Hydrochlorothiazide morning of the test.       After the Test: Drink plenty of water. After receiving IV contrast, you may experience a mild flushed feeling. This is normal. On occasion, you may experience a mild rash up to 24 hours after the test. This is not dangerous. If this occurs, you can take Benadryl 25 mg and increase your fluid intake. If you experience trouble breathing, this can be serious. If it is severe call 911 IMMEDIATELY. If it is mild, please call our office.   Please allow 2-4  weeks for scheduling of routine cardiac CTs. Some insurance companies require a pre-authorization which may delay scheduling of this test.   For non-scheduling related questions, please contact the cardiac imaging nurse navigator should you have any questions/concerns: Rockwell Alexandria, Cardiac Imaging Nurse Navigator Larey Brick, Cardiac Imaging Nurse Navigator [facility] Direct Office Dial: [phone]   For scheduling needs, including  cancellations and rescheduling, please call [place], [phone].    Follow-Up: At [facility], you and your health needs are our priority.  As part of our continuing mission to provide you with exceptional heart care, we have created designated Provider Care Teams.  These Care Teams include your primary Cardiologist (physician) and Advanced Practice Providers (APPs -  Physician Assistants and Nurse Practitioners) who all work together to provide you with the care you need, when you need it.  We recommend signing up for the patient portal called "MyChart".  Sign up information is provided on this After Visit Summary.  MyChart is used to connect with patients for Virtual Visits (Telemedicine).  Patients are able to view lab/test results, encounter notes, upcoming appointments, etc.  Non-urgent messages can be sent to your provider as well.   To learn more about what you can do with MyChart, go to [email or web address].    Your next appointment:   4-5 week(s)  The format for your next appointment:   In Person  Provider:   You may see Debbe Odea, MD or one of the following Advanced Practice Providers on your designated Care Team:   Nicolasa Ducking, NP Eula Listen, PA-C Marisue Ivan, PA-C Cadence Fransico Michael, [place]   Other Instructions   Echocardiogram An echocardiogram is a test that uses sound waves (ultrasound) to produce images of the heart. Images from an echocardiogram can provide important information about: Heart size and shape. The size and thickness and movement of your heart's walls. Heart muscle function and strength. Heart valve function or if you have stenosis. Stenosis is when the heart valves are too narrow. If blood is flowing backward through the heart valves (regurgitation). A tumor or infectious growth around the heart valves. Areas of heart muscle that are not working well because of poor blood flow or injury from a heart attack. Aneurysm detection. An  aneurysm is a weak or damaged part of an artery wall. The wall bulges out from the normal force of blood pumping through the body. Tell a health care provider about: Any allergies you have. All medicines you are taking, including vitamins, herbs, eye drops, creams, and over-the-counter medicines. Any blood disorders you have. Any surgeries you have had. Any medical conditions you have. Whether you are pregnant or may be pregnant. What are the risks? Generally, this is a safe test. However, problems may occur, including an allergic reaction to dye (contrast) that may be used during the test. What happens before the test? No specific preparation is needed. You may eat and drink normally. What happens during the test?  You will take off your clothes from the waist up and put on a hospital gown. Electrodes or electrocardiogram (ECG)patches may be placed on your chest. The electrodes or patches are then connected to a device that monitors your heart rate and rhythm. You will lie down on a table for an ultrasound exam. A gel will be applied to your chest to help sound waves pass through your skin. A handheld device, called a transducer, will be pressed against your chest and moved over your heart. The transducer  produces sound waves that travel to your heart and bounce back (or "echo" back) to the transducer. These sound waves will be captured in real-time and changed into images of your heart that can be viewed on a video monitor. The images will be recorded on a computer and reviewed by your health care provider. You may be asked to change positions or hold your breath for a short time. This makes it easier to get different views or better views of your heart. In some cases, you may receive contrast through an IV in one of your veins. This can improve the quality of the pictures from your heart. The procedure may vary among health care providers and hospitals. What can I expect after the test? You  may return to your normal, everyday life, including diet, activities, andmedicines, unless your health care provider tells you not to do that. Follow these instructions at home: It is up to you to get the results of your test. Ask your health care provider, or the department that is doing the test, when your results will be ready. Keep all follow-up visits. This is important. Summary An echocardiogram is a test that uses sound waves (ultrasound) to produce images of the heart. Images from an echocardiogram can provide important information about the size and shape of your heart, heart muscle function, heart valve function, and other possible heart problems. You do not need to do anything to prepare before this test. You may eat and drink normally. After the echocardiogram is completed, you may return to your normal, everyday life, unless your health care provider tells you not to do that. This information is not intended to replace advice given to you by your health care provider. Make sure you discuss any questions you have with your healthcare provider. Document Revised: 05/17/2020 Document Reviewed: 05/17/2020 Elsevier Patient Education  2022 Elsevier Inc.   Cardiac CT Angiogram A cardiac CT angiogram is a procedure to look at the heart and the area around the heart. It may be done to help find the cause of chest pains or other symptoms of heart disease. During this procedure, a substance called contrast dye is injected into the blood vessels in the area to be checked. A large X-ray machine, called a CT scanner, then takes detailed pictures of the heart and the surrounding area. The procedure is also sometimes called a coronary CTangiogram, coronary artery scanning, or CTA. A cardiac CT angiogram allows the health care provider to see how well blood is flowing to and from the heart. The health care provider will be able to see if there are any problems, such as: Blockage or narrowing of the  coronary arteries in the heart. Fluid around the heart. Signs of weakness or disease in the muscles, valves, and tissues of the heart. Tell a health care provider about: Any allergies you have. This is especially important if you have had a previous allergic reaction to contrast dye. All medicines you are taking, including vitamins, herbs, eye drops, creams, and over-the-counter medicines. Any blood disorders you have. Any surgeries you have had. Any medical conditions you have. Whether you are pregnant or may be pregnant. Any anxiety disorders, chronic pain, or other conditions you have that may increase your stress or prevent you from lying still. What are the risks? Generally, this is a safe procedure. However, problems may occur, including: Bleeding. Infection. Allergic reactions to medicines or dyes. Damage to other structures or organs. Kidney damage from the contrast dye that  is used. Increased risk of cancer from radiation exposure. This risk is low. Talk with your health care provider about: The risks and benefits of testing. How you can receive the lowest dose of radiation. What happens before the procedure? Wear comfortable clothing and remove any jewelry, glasses, dentures, and hearing aids. Follow instructions from your health care provider about eating and drinking. This may include: For 12 hours before the procedure -- avoid caffeine. This includes tea, coffee, soda, energy drinks, and diet pills. Drink plenty of water or other fluids that do not have caffeine in them. Being well hydrated can prevent complications. For 4-6 hours before the procedure -- stop eating and drinking. The contrast dye can cause nausea, but this is less likely if your stomach is empty. Ask your health care provider about changing or stopping your regular medicines. This is especially important if you are taking diabetes medicines, blood thinners, or medicines to treat problems with erections  (erectile dysfunction). What happens during the procedure?  Hair on your chest may need to be removed so that small sticky patches called electrodes can be placed on your chest. These will transmit information that helps to monitor your heart during the procedure. An IV will be inserted into one of your veins. You might be given a medicine to control your heart rate during the procedure. This will help to ensure that good images are obtained. You will be asked to lie on an exam table. This table will slide in and out of the CT machine during the procedure. Contrast dye will be injected into the IV. You might feel warm, or you may get a metallic taste in your mouth. You will be given a medicine called nitroglycerin. This will relax or dilate the arteries in your heart. The table that you are lying on will move into the CT machine tunnel for the scan. The person running the machine will give you instructions while the scans are being done. You may be asked to: Keep your arms above your head. Hold your breath. Stay very still, even if the table is moving. When the scanning is complete, you will be moved out of the machine. The IV will be removed. The procedure may vary among health care providers and hospitals. What can I expect after the procedure? After your procedure, it is common to have: A metallic taste in your mouth from the contrast dye. A feeling of warmth. A headache from the nitroglycerin. Follow these instructions at home: Take over-the-counter and prescription medicines only as told by your health care provider. If you are told, drink enough fluid to keep your urine pale yellow. This will help to flush the contrast dye out of your body. Most people can return to their normal activities right after the procedure. Ask your health care provider what activities are safe for you. It is up to you to get the results of your procedure. Ask your health care provider, or the department that  is doing the procedure, when your results will be ready. Keep all follow-up visits as told by your health care provider. This is important. Contact a health care provider if: You have any symptoms of allergy to the contrast dye. These include: Shortness of breath. Rash or hives. A racing heartbeat. Summary A cardiac CT angiogram is a procedure to look at the heart and the area around the heart. It may be done to help find the cause of chest pains or other symptoms of heart disease. During this  procedure, a large X-ray machine, called a CT scanner, takes detailed pictures of the heart and the surrounding area after a contrast dye has been injected into blood vessels in the area. Ask your health care provider about changing or stopping your regular medicines before the procedure. This is especially important if you are taking diabetes medicines, blood thinners, or medicines to treat erectile dysfunction. If you are told, drink enough fluid to keep your urine pale yellow. This will help to flush the contrast dye out of your body. This information is not intended to replace advice given to you by your health care provider. Make sure you discuss any questions you have with your healthcare provider. Document Revised: 05/20/2019 Document Reviewed: 05/20/2019 Elsevier Patient Education  2022 [employer].

## 2021-05-09 LAB — LIPID PANEL
Chol/HDL Ratio: 2.8 ratio (ref 0.0–5.0)
Cholesterol, Total: 138 mg/dL (ref 100–199)
HDL: 49 mg/dL (ref >39)
LDL Chol Calc (NIH): 79 mg/dL (ref 0–99)
Triglycerides: 46 mg/dL (ref 0–149)
VLDL Cholesterol Cal: 10 mg/dL (ref 5–40)

## 2021-05-09 LAB — BASIC METABOLIC PANEL
BUN/Creatinine Ratio: 10 (ref 10–24)
BUN: 14 mg/dL (ref 8–27)
CO2: 23 mmol/L (ref 20–29)
Calcium: 8.8 mg/dL (ref 8.6–10.2)
Chloride: 106 mmol/L (ref 96–106)
Creatinine, Ser: 1.42 mg/dL — ABNORMAL HIGH (ref 0.76–1.27)
Glucose: 106 mg/dL — ABNORMAL HIGH (ref 65–99)
Potassium: 4.3 mmol/L (ref 3.5–5.2)
Sodium: 141 mmol/L (ref 134–144)
eGFR: 57 mL/min/{1.73_m2} — ABNORMAL LOW (ref >59)

## 2021-05-17 ENCOUNTER — Telehealth (HOSPITAL_COMMUNITY): Payer: Self-pay | Admitting: Emergency Medicine

## 2021-05-17 NOTE — Telephone Encounter (Signed)
Attempted to call patient regarding upcoming cardiac CT appointment. °Left message on voicemail with name and callback number °Alicia Seib RN Navigator Cardiac Imaging °[facility] °[phone] Office °[phone] Cell ° °

## 2021-05-18 ENCOUNTER — Ambulatory Visit
Admission: RE | Admit: 2021-05-18 | Discharge: 2021-05-18 | Disposition: A | Discharge status: Home / Self Care | Payer: BC Managed Care – PPO | Source: Ambulatory Visit | Attending: Cardiology | Admitting: Cardiology

## 2021-05-18 ENCOUNTER — Other Ambulatory Visit: Payer: Self-pay

## 2021-05-18 DIAGNOSIS — R072 Precordial pain: Secondary | ICD-10-CM | POA: Insufficient documentation

## 2021-05-18 MED ORDER — METOPROLOL TARTRATE 5 MG/5ML IV SOLN
10.0000 mg | Freq: Once | INTRAVENOUS | Status: AC
  Administered 2021-05-18: 10 mg via INTRAVENOUS

## 2021-05-18 MED ORDER — IOHEXOL 350 MG/ML SOLN
100.0000 mL | Freq: Once | INTRAVENOUS | Status: AC | PRN
  Administered 2021-05-18: 100 mL via INTRAVENOUS

## 2021-05-18 MED ORDER — NITROGLYCERIN 0.4 MG SL SUBL
0.8000 mg | SUBLINGUAL_TABLET | Freq: Once | SUBLINGUAL | Status: AC
  Administered 2021-05-18: 0.8 mg via SUBLINGUAL

## 2021-05-18 NOTE — Progress Notes (Signed)
Patient tolerated CT well. Vital signs stable encourage to drink water throughout day.Reasons explained and verbalized understanding. Ambulated steady gait.   

## 2021-05-22 ENCOUNTER — Ambulatory Visit: Payer: BC Managed Care – PPO

## 2021-06-14 ENCOUNTER — Ambulatory Visit (INDEPENDENT_AMBULATORY_CARE_PROVIDER_SITE_OTHER): Payer: BC Managed Care – PPO

## 2021-06-14 ENCOUNTER — Other Ambulatory Visit: Payer: Self-pay

## 2021-06-14 DIAGNOSIS — R072 Precordial pain: Secondary | ICD-10-CM

## 2021-06-14 DIAGNOSIS — I10 Essential (primary) hypertension: Secondary | ICD-10-CM

## 2021-06-14 LAB — ECHOCARDIOGRAM COMPLETE
AR max vel: 3.48 cm2
AV Area VTI: 3.33 cm2
AV Area mean vel: 3.44 cm2
AV Mean grad: 4 mmHg
AV Peak grad: 6.9 mmHg
Ao pk vel: 1.31 m/s
Area-P 1/2: 3.85 cm2
S' Lateral: 3.5 cm
Single Plane A4C EF: 51 %

## 2021-06-14 NOTE — Progress Notes (Signed)
Cardiology Office Note:    Date:  06/15/2021   ID:  Malik Gonzalez, DOB 1961/04/30, MRN [id number]  PCP:  Dois Davenport, MD  [facility] Cardiologist:  Debbe Odea, MD  [facility] Electrophysiologist:  None   Referring MD: Dois Davenport, MD   Chief Complaint: 4-5 week follow-up.   History of Present Illness:    Malik Gonzalez is a 60 y.o. male with a hx of HTN who is following up for chest pain.   Seen 05/08/21 for the first time. He had prior episode of chest pain with elevated BP. An echo and cardiac CT were ordered. Echo showed LVEF 55-60%, no WMA, normal diastolic parameters, mild MR, mild dilation of aorta 64mm. Cardiac CT showed calcium score 35.7, minimal LAD calcification.   Today, the patient reports he has been doing well since the last visit. Sometimes when he sits a long time he has numbness sin the shoulder. When he sits for a long time and then gets up he feels pain improve. Pain can be sharp and shooting. He has an allergy to Aspirin. BP mildly elevated but feels a little nervous. BP at home 120/80. Discussed lifestyle changes. Not on a statin. No LLE, orthopnea, pnd.   Past Medical History:  Diagnosis Date   Hypertension     History reviewed. No pertinent surgical history.  Current Medications: Current Meds  Medication Sig   acetaminophen (TYLENOL) 500 MG tablet Take 1,000 mg by mouth every 6 (six) hours as needed. For pain   albuterol (PROVENTIL HFA;VENTOLIN HFA) 108 (90 BASE) MCG/ACT inhaler Inhale 2 puffs into the lungs every 6 (six) hours as needed for wheezing or shortness of breath.   fluticasone (FLONASE) 50 MCG/ACT nasal spray Place 2 sprays into both nostrils daily.   hydrochlorothiazide (MICROZIDE) 12.5 MG capsule Take 1 capsule (12.5 mg total) by mouth daily.   Multiple Vitamin (MULTIVITAMIN WITH MINERALS) TABS Take 1 tablet by mouth daily.   olmesartan (BENICAR) 20 MG tablet Take 10 mg by mouth daily.     Allergies:   Aspirin    Social History   Socioeconomic History   Marital status: Divorced    Spouse name: Not on file   Number of children: Not on file   Years of education: Not on file   Highest education level: Not on file  Occupational History   Not on file  Tobacco Use   Smoking status: Never   Smokeless tobacco: Never  Vaping Use   Vaping Use: Never used  Substance and Sexual Activity   Alcohol use: Yes    Comment: occassional-on holidays   Drug use: No   Sexual activity: Not on file  Other Topics Concern   Not on file  Social History Narrative   Not on file   Social Determinants of Health   Financial Resource Strain: Not on file  Food Insecurity: Not on file  Transportation Needs: Not on file  Physical Activity: Not on file  Stress: Not on file  Social Connections: Not on file     Family History: The patient's family history includes Heart disease in his father and mother; Hypertension in his father and mother.  ROS:   Please see the history of present illness.     All other systems reviewed and are negative.  EKGs/Labs/Other Studies Reviewed:    The following studies were reviewed today:  Coronary CT 05/2021 IMPRESSION: 1. Minimal coronary calcium score of 35.7. Patient is low risk for coronary events.  2. Normal coronary origin with right dominance.   3. Minimal LAD calcification.   4. CAD-RADS 1. Minimal non obstructive disease. Consider non-atherosclerotic causes of chest pain.  Echo 06/14/21  1. Left ventricular ejection fraction, by estimation, is 55 to 60%. The  left ventricle has normal function. The left ventricle has no regional  wall motion abnormalities. Left ventricular diastolic parameters were  normal.   2. Right ventricular systolic function is normal. The right ventricular  size is normal. There is normal pulmonary artery systolic pressure.   3. The mitral valve is normal in structure. Mild mitral valve  regurgitation.   4. The aortic valve is  tricuspid. Aortic valve regurgitation is not  visualized.   5. Aortic dilatation noted. There is mild dilatation of the ascending  aorta, measuring 39 mm.   6. The inferior vena cava is normal in size with greater than 50%  respiratory variability, suggesting right atrial pressure of 3 mmHg.   EKG:  EKG is not ordered today.    Recent Labs: 03/30/2021: ALT 26; Hemoglobin 13.7; Platelets 196 05/08/2021: BUN 14; Creatinine, Ser 1.42; Potassium 4.3; Sodium 141  Recent Lipid Panel    Component Value Date/Time   CHOL 138 05/08/2021 0923   TRIG 46 05/08/2021 0923   HDL 49 05/08/2021 0923   CHOLHDL 2.8 05/08/2021 0923   LDLCALC 79 05/08/2021 0923    Physical Exam:    VS:  BP (!) 140/98 (BP Location: Left Arm, Patient Position: Sitting, Cuff Size: Large)   Pulse 71   Ht 5' 9.5" (1.765 m)   Wt 248 lb (112.5 kg)   SpO2 98%   BMI 36.10 kg/m     Wt Readings from Last 3 Encounters:  06/15/21 248 lb (112.5 kg)  05/08/21 252 lb (114.3 kg)  03/30/21 240 lb (108.9 kg)     GEN:  Well nourished, well developed in no acute distress HEENT: Normal NECK: No JVD; No carotid bruits LYMPHATICS: No lymphadenopathy CARDIAC: RRR, no murmurs, rubs, gallops RESPIRATORY:  Clear to auscultation without rales, wheezing or rhonchi  ABDOMEN: Soft, non-tender, non-distended MUSCULOSKELETAL:  No edema; No deformity  SKIN: Warm and dry NEUROLOGIC:  Alert and oriented x 3 PSYCHIATRIC:  Normal affect   ASSESSMENT:    1. Coronary artery disease involving coronary bypass graft of native heart without angina pectoris   2. Essential hypertension   3. Dyslipidemia   4. Dilation of aorta (HCC)    PLAN:    In order of problems listed above:  Minimal nonobstructive CAD Echo and Cardiac CTA reviewed during the visit. Echo LVEF 55-60%, no WMA, normal diastolic parameters, mild MR, mild dilation of the ascending aorta at 9mm. Cardiac CT showed calcium score of 35.7, minimal LAD calcification. Shoulder/arm  pain seem to be neurologic. Patient is allergic to aspirin. Not on satin, would like to work on lifestyle changes first.   HTN He reports BPs at home 120/80s. Continue HCTZ 12.5mg  daily and Benicar 10mg  daily.   Dyslipidemia LDL 79, goal<70 with CAD. He will work on lifestyle changes  Mild aortic dilation Mild dilation of ascending aorta measuring 67mm. Continue BP control   Disposition: Follow up in 4-5 month(s) with MD   Signed, Sheryn Aldaz 25m, PA-C  06/15/2021 1:23 PM    [facility]

## 2021-06-15 ENCOUNTER — Ambulatory Visit: Payer: BC Managed Care – PPO | Admitting: Medical

## 2021-06-15 ENCOUNTER — Encounter: Payer: Self-pay | Admitting: Medical

## 2021-06-15 VITALS — BP 140/98 | HR 71 | Ht 69.5 in | Wt 248.0 lb

## 2021-06-15 DIAGNOSIS — E785 Hyperlipidemia, unspecified: Secondary | ICD-10-CM | POA: Diagnosis not present

## 2021-06-15 DIAGNOSIS — I25810 Atherosclerosis of coronary artery bypass graft(s) without angina pectoris: Secondary | ICD-10-CM | POA: Diagnosis not present

## 2021-06-15 DIAGNOSIS — I77819 Aortic ectasia, unspecified site: Secondary | ICD-10-CM | POA: Diagnosis not present

## 2021-06-15 DIAGNOSIS — I10 Essential (primary) hypertension: Secondary | ICD-10-CM

## 2021-06-15 NOTE — Patient Instructions (Addendum)
Medication Instructions:   Your physician recommends that you continue on your current medications as directed. Please refer to the Current Medication list given to you today.  *If you need a refill on your cardiac medications before your next appointment, please call your pharmacy*   Lab Work:  None ordered  Testing/Procedures:  None ordered   Follow-Up: At [facility], you and your health needs are our priority.  As part of our continuing mission to provide you with exceptional heart care, we have created designated Provider Care Teams.  These Care Teams include your primary Cardiologist (physician) and Advanced Practice Providers (APPs -  Physician Assistants and Nurse Practitioners) who all work together to provide you with the care you need, when you need it.  We recommend signing up for the patient portal called "MyChart".  Sign up information is provided on this After Visit Summary.  MyChart is used to connect with patients for Virtual Visits (Telemedicine).  Patients are able to view lab/test results, encounter notes, upcoming appointments, etc.  Non-urgent messages can be sent to your provider as well.   To learn more about what you can do with MyChart, go to [email or web address].    Your next appointment:    Follow up January 2023   The format for your next appointment:   In Person  Provider:   Debbe Odea, MD   Other Instructions  1) Your provider recommends lifestyle changes as discussed today in clinic.  2) Your provider recommends that you monitor your blood pressure at home:   Tips to Measure your Blood Pressure Correctly  To determine whether you have hypertension, a medical professional will take a blood pressure reading. How you prepare for the test, the position of your arm, and other factors can change a blood pressure reading by 10% or more. That could be enough to hide high blood pressure, start you on a drug you don't really need, or lead  your doctor to incorrectly adjust your medications.  National and international guidelines offer specific instructions for measuring blood pressure. If a doctor, nurse, or medical assistant isn't doing it right, don't hesitate to ask him or her to get with the guidelines.  Here's what you can do to ensure a correct reading:  Don't drink a caffeinated beverage or smoke during the 30 minutes before the test.  Sit quietly for five minutes before the test begins.  During the measurement, sit in a chair with your feet on the floor and your arm supported so your elbow is at about heart level.  The inflatable part of the cuff should completely cover at least 80% of your upper arm, and the cuff should be placed on bare skin, not over a shirt.  Don't talk during the measurement.  Have your blood pressure measured twice, with a brief break in between. If the readings are different by 5 points or more, have it done a third time.  In 2017, new guidelines from the American Heart Association, the [employer] of Cardiology, and nine other health organizations lowered the diagnosis of high blood pressure to 130/80 mm Hg or higher for all adults. The guidelines also redefined the various blood pressure categories to now include normal, elevated, Stage 1 hypertension, Stage 2 hypertension, and hypertensive crisis (see "Blood pressure categories").  Blood pressure categories  Blood pressure category SYSTOLIC (upper number)  DIASTOLIC (lower number)  Normal Less than 120 mm Hg and Less than 80 mm Hg  Elevated 120-129 mm Hg and Less  than 80 mm Hg  High blood pressure: Stage 1 hypertension 130-139 mm Hg or 80-89 mm Hg  High blood pressure: Stage 2 hypertension 140 mm Hg or higher or 90 mm Hg or higher  Hypertensive crisis (consult your doctor immediately) Higher than 180 mm Hg and/or Higher than 120 mm Hg  Source: American Heart Association and American Stroke Association. For more on getting your blood  pressure under control, buy Controlling Your Blood Pressure, a Special Health Report from [facility].   Blood Pressure Log   Date   Time  Blood Pressure  Position  Example: Nov 1 9 AM 124/78 sitting

## 2021-07-13 IMAGING — US US SCROTUM W/ DOPPLER COMPLETE
1 series · 13 of 25 positions shown · non-contrast
Comparison: None

CLINICAL DATA: Acute prostatitis, enlarged RIGHT testicle/RIGHT
testicular swelling for 2 weeks

EXAM:
SCROTAL ULTRASOUND
DOPPLER ULTRASOUND OF THE TESTICLES
TECHNIQUE: Complete ultrasound examination of the testicles, epididymis, and
other scrotal structures was performed. Color and spectral Doppler
ultrasound were also utilized to evaluate blood flow to the
testicles.

[Series 1: us scrotum w/ doppler complete · 0.08mm/px · 13 of 60 slices shown]
[im 1/60]
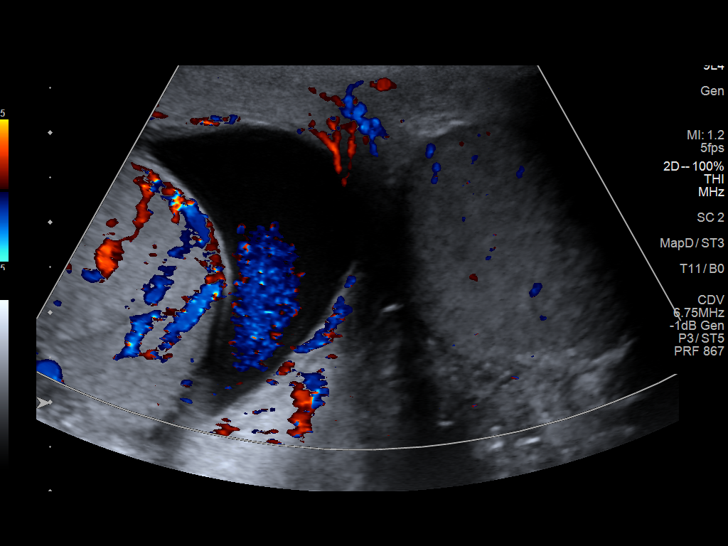
[im 5/60]
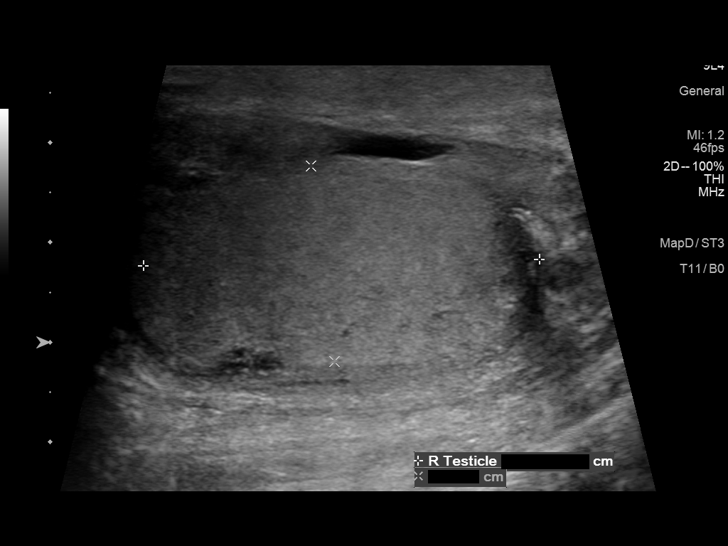
[im 10/60]
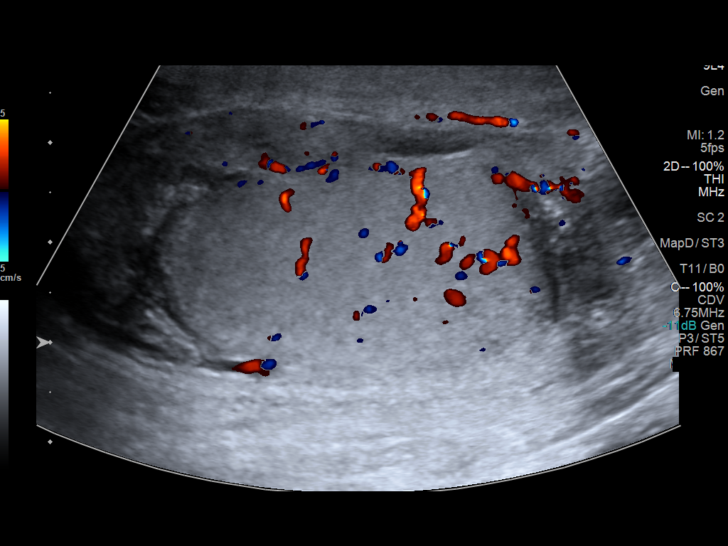
[im 15/60]
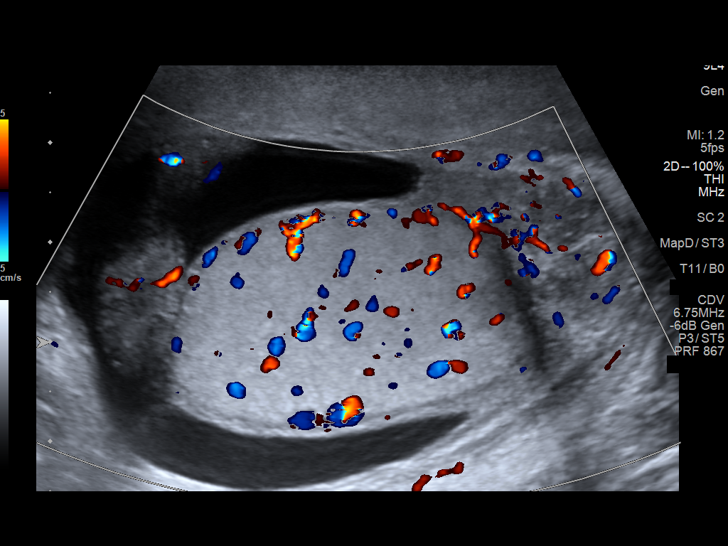
[im 20/60]
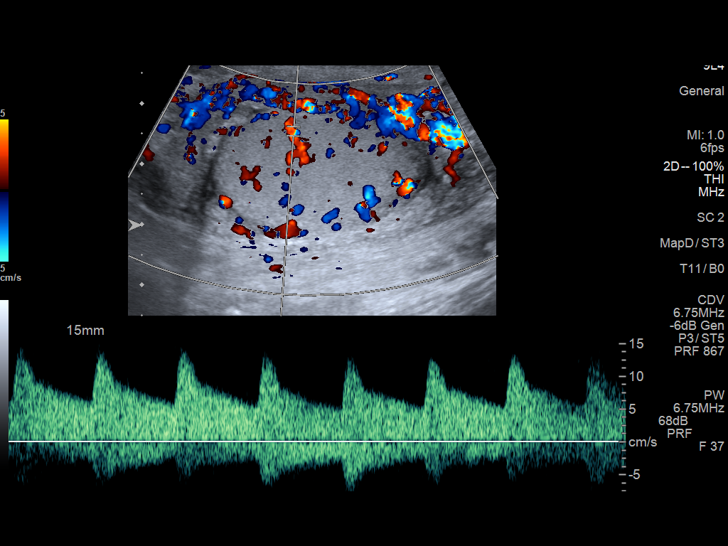
[im 25/60]
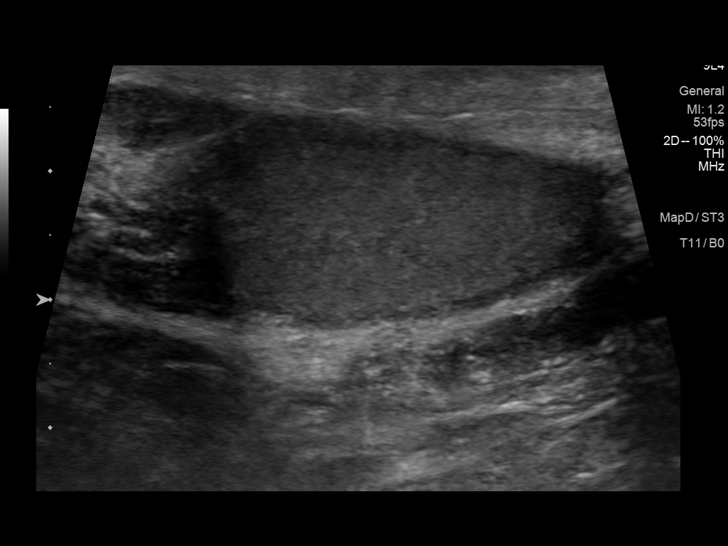
[im 30/60]
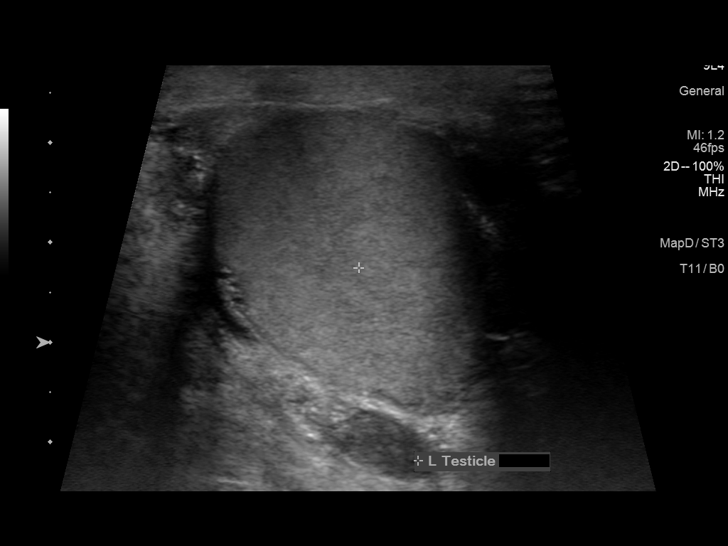
[im 35/60]
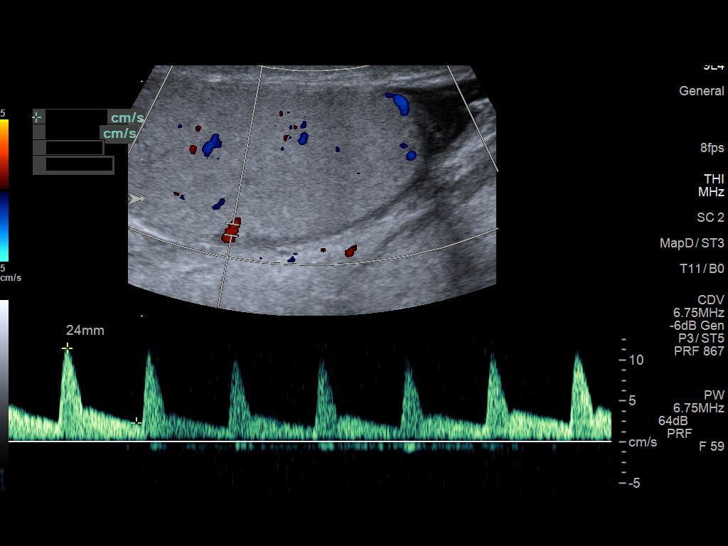
[im 40/60]
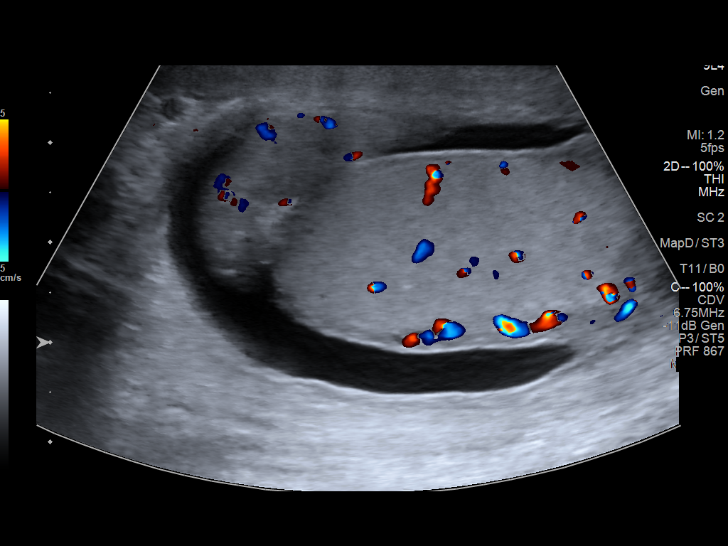
[im 45/60]
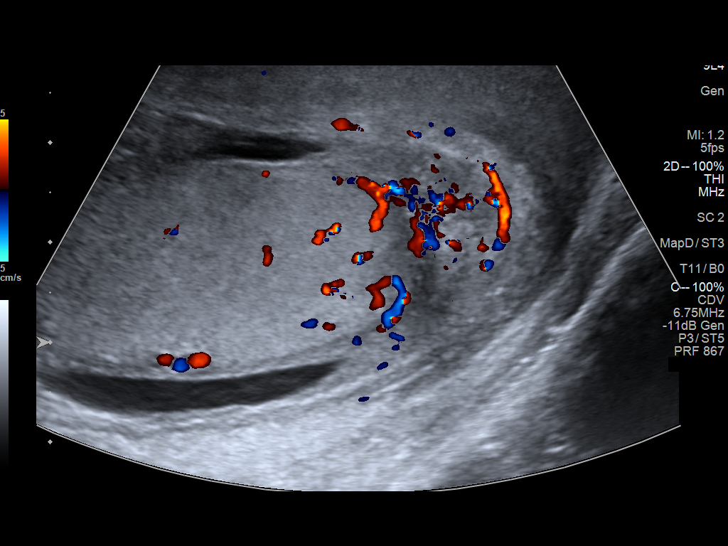
[im 50/60]
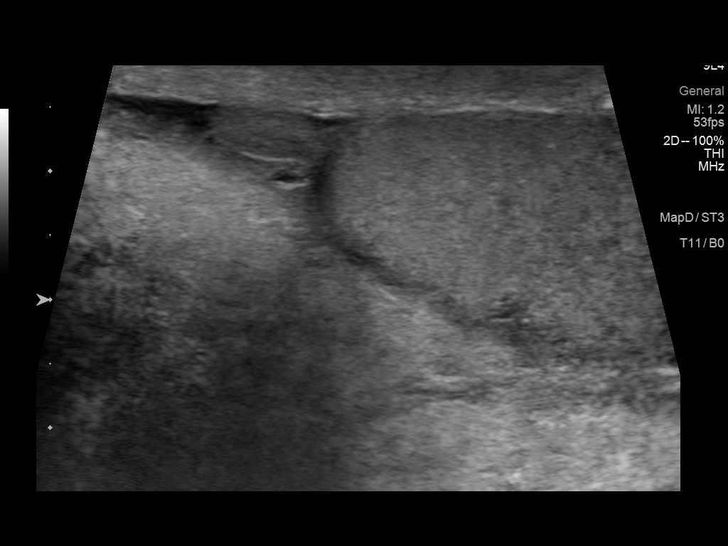
[im 55/60]
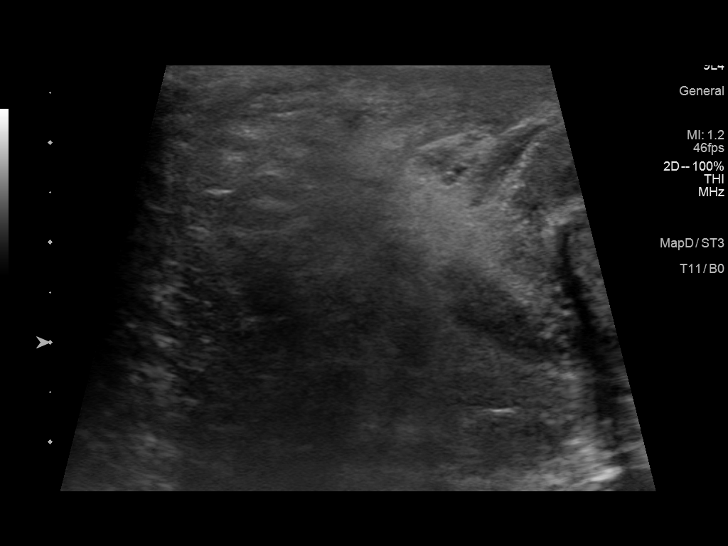
[im 60/60]
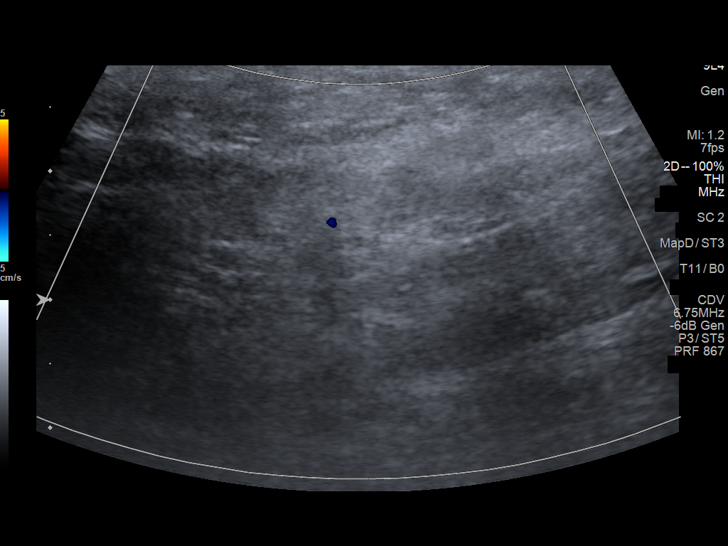

[13 of 25 positions shown; findings below may reference images not displayed]

FINDINGS: Right testicle

Measurements: 4.0 x 2.0 x 2.7 cm. Normal echogenicity without mass
or calcification. Subtle focus of tubular ectasia of the rete testis
seen peripherally. Significantly increased flow within RIGHT testis
on color Doppler imaging versus LEFT.

Left testicle

Measurements: 4.2 x 2.0 x 2.5 cm. Normal echogenicity without mass
or calcification. Internal blood flow present on color Doppler
imaging.

Right epididymis: Normal in size and appearance. Hypervascular on
color Doppler imaging.

Left epididymis:  Normal in size and appearance.

Hydrocele:  Small RIGHT hydrocele containing minimal debris

Varicocele:  Absent bilaterally

Pulsed Doppler interrogation of both testes demonstrates normal low
resistance arterial and venous waveforms bilaterally.
IMPRESSION: Small RIGHT hydrocele.

Significant hypervascularity involving the RIGHT testis and RIGHT
epididymis versus LEFT consistent with epididymal orchitis.

No evidence of testicular mass or torsion.

## 2021-10-06 NOTE — Progress Notes (Signed)
Cardiology Office Note:    Date:  10/12/2021   ID:  Malik Gonzalez, DOB 17-Jan-1961, MRN [id number]  PCP:  Dois Davenport, MD  [facility] Cardiologist:  Debbe Odea, MD  [facility] Electrophysiologist:  None   Referring MD: Dois Davenport, MD   Chief Complaint: 3 month follow-up  History of Present Illness:    Malik Gonzalez is a 60 y.o. male with a hx of HTN, HLD, nonobstructive CAD who presents for follow-up.   Seen 05/08/21 for the first time. He had prior episode of chest pain with elevated BP. An echo and cardiac CT were ordered. Echo showed LVEF 55-60%, no WMA, normal diastolic parameters, mild MR, mild dilation of aorta 84mm. Cardiac CT showed calcium score 35.7, minimal LAD calcification.   Last seen 06/15/21 and was doing well. Allergy to ASA.   Today, the patient reports he has been doing well. Has been watching weight. Noted BP is higher since the Holidays and eating more. He is walking about 1/2 mile a day. Trying to stay away from unhealthy foods. BP is high today. At home he reports BP is better, 130/90. No chest pain, SOB, orthopnea, pnd. Had some LLE over the Holidays. He takes HCTZ as needed for ankle swelling.   Past Medical History:  Diagnosis Date   Hypertension     History reviewed. No pertinent surgical history.  Current Medications: Current Meds  Medication Sig   acetaminophen (TYLENOL) 500 MG tablet Take 1,000 mg by mouth every 6 (six) hours as needed. For pain   albuterol (PROVENTIL HFA;VENTOLIN HFA) 108 (90 BASE) MCG/ACT inhaler Inhale 2 puffs into the lungs every 6 (six) hours as needed for wheezing or shortness of breath.   fluticasone (FLONASE) 50 MCG/ACT nasal spray Place 2 sprays into both nostrils daily.   Multiple Vitamin (MULTIVITAMIN WITH MINERALS) TABS Take 1 tablet by mouth daily.   [DISCONTINUED] hydrochlorothiazide (MICROZIDE) 12.5 MG capsule Take 1 capsule (12.5 mg total) by mouth daily.   [DISCONTINUED] olmesartan (BENICAR)  20 MG tablet Take 10 mg by mouth daily.     Allergies:   Aspirin   Social History   Socioeconomic History   Marital status: Divorced    Spouse name: Not on file   Number of children: Not on file   Years of education: Not on file   Highest education level: Not on file  Occupational History   Not on file  Tobacco Use   Smoking status: Never   Smokeless tobacco: Never  Vaping Use   Vaping Use: Never used  Substance and Sexual Activity   Alcohol use: Yes    Comment: occassional-on holidays   Drug use: No   Sexual activity: Not on file  Other Topics Concern   Not on file  Social History Narrative   Not on file   Social Determinants of Health   Financial Resource Strain: Not on file  Food Insecurity: Not on file  Transportation Needs: Not on file  Physical Activity: Not on file  Stress: Not on file  Social Connections: Not on file     Family History: The patient's family history includes Heart disease in his father and mother; Hypertension in his father and mother.  ROS:   Please see the history of present illness.     All other systems reviewed and are negative.  EKGs/Labs/Other Studies Reviewed:    The following studies were reviewed today:    Coronary CT 05/2021 IMPRESSION: 1. Minimal coronary calcium score  of 35.7. Patient is low risk for coronary events.   2. Normal coronary origin with right dominance.   3. Minimal LAD calcification.   4. CAD-RADS 1. Minimal non obstructive disease. Consider non-atherosclerotic causes of chest pain.   Echo 06/14/21  1. Left ventricular ejection fraction, by estimation, is 55 to 60%. The  left ventricle has normal function. The left ventricle has no regional  wall motion abnormalities. Left ventricular diastolic parameters were  normal.   2. Right ventricular systolic function is normal. The right ventricular  size is normal. There is normal pulmonary artery systolic pressure.   3. The mitral valve is normal in  structure. Mild mitral valve  regurgitation.   4. The aortic valve is tricuspid. Aortic valve regurgitation is not  visualized.   5. Aortic dilatation noted. There is mild dilatation of the ascending  aorta, measuring 39 mm.   6. The inferior vena cava is normal in size with greater than 50%  respiratory variability, suggesting right atrial pressure of 3 mmHg.   EKG:  EKG is ordered today.  The ekg ordered today demonstrates NSR, 60bpm, TWI III, no changes  Recent Labs: 03/30/2021: ALT 26; Hemoglobin 13.7; Platelets 196 05/08/2021: BUN 14; Creatinine, Ser 1.42; Potassium 4.3; Sodium 141  Recent Lipid Panel    Component Value Date/Time   CHOL 138 05/08/2021 0923   TRIG 46 05/08/2021 0923   HDL 49 05/08/2021 0923   CHOLHDL 2.8 05/08/2021 0923   LDLCALC 79 05/08/2021 0923     Physical Exam:    VS:  BP (!) 150/110 (BP Location: Left Arm, Patient Position: Sitting, Cuff Size: Normal)    Pulse 60    Ht 5\' 10"  (1.778 m)    Wt 251 lb (113.9 kg)    BMI 36.01 kg/m     Wt Readings from Last 3 Encounters:  10/12/21 251 lb (113.9 kg)  06/15/21 248 lb (112.5 kg)  05/08/21 252 lb (114.3 kg)     GEN:  Well nourished, well developed in no acute distress HEENT: Normal NECK: No JVD; No carotid bruits LYMPHATICS: No lymphadenopathy CARDIAC: RRR, no murmurs, rubs, gallops RESPIRATORY:  Clear to auscultation without rales, wheezing or rhonchi  ABDOMEN: Soft, non-tender, non-distended MUSCULOSKELETAL:  No edema; No deformity  SKIN: Warm and dry NEUROLOGIC:  Alert and oriented x 3 PSYCHIATRIC:  Normal affect   ASSESSMENT:    1. Coronary artery disease involving coronary bypass graft of native heart without angina pectoris   2. Essential hypertension   3. Dilation of aorta (HCC)   4. Dyslipidemia    PLAN:    In order of problems listed above:  Minimal nonobstructive CAD Patient denies anginal symptoms. He is allergic to ASA. No BB with HR 60. LDL 79, may start statin depending on labs  today. No further ischemic work-up indicated.   HTN BP elevated today, but says BP at home normally 120-130/80-90s. He takes Benicar daily. He has HCTZ, but only takes it as needed. He rarely needs HCTZ, but did take it over the Holidays given salty food/alcohol intake. I will check BMET today. Recommend lifestyle changes.   Dyslipidemia LDL 79, goal <70. Patient wants to check a cholesterol panel before starting cholesterol medication. Lipid profile today.   Mild aortic dilation Mild dilation of ascending aorta measuring 109mm. Continue BP control.  Disposition: Follow up in 3 month(s) with MD/APP     Signed, Sharma Lawrance 24m, PA-C  10/12/2021 8:54 AM    [facility]

## 2021-10-12 ENCOUNTER — Encounter: Payer: Self-pay | Admitting: Medical

## 2021-10-12 ENCOUNTER — Ambulatory Visit (INDEPENDENT_AMBULATORY_CARE_PROVIDER_SITE_OTHER): Payer: BC Managed Care – PPO | Admitting: Medical

## 2021-10-12 ENCOUNTER — Other Ambulatory Visit: Payer: Self-pay

## 2021-10-12 VITALS — BP 150/110 | HR 60 | Ht 70.0 in | Wt 251.0 lb

## 2021-10-12 DIAGNOSIS — I25810 Atherosclerosis of coronary artery bypass graft(s) without angina pectoris: Secondary | ICD-10-CM

## 2021-10-12 DIAGNOSIS — E785 Hyperlipidemia, unspecified: Secondary | ICD-10-CM | POA: Diagnosis not present

## 2021-10-12 DIAGNOSIS — I10 Essential (primary) hypertension: Secondary | ICD-10-CM

## 2021-10-12 DIAGNOSIS — I77819 Aortic ectasia, unspecified site: Secondary | ICD-10-CM | POA: Diagnosis not present

## 2021-10-12 MED ORDER — HYDROCHLOROTHIAZIDE 12.5 MG PO CAPS: 12.5000 mg | ORAL_CAPSULE | Freq: Every day | ORAL | 0 refills | Status: AC | PRN

## 2021-10-12 MED ORDER — OLMESARTAN MEDOXOMIL 20 MG PO TABS: 10.0000 mg | ORAL_TABLET | Freq: Every day | ORAL | 0 refills | Status: AC

## 2021-10-12 NOTE — Patient Instructions (Addendum)
Medication Instructions:   Your physician recommends that you continue on your current medications as directed. Please refer to the Current Medication list given to you today.  *If you need a refill on your cardiac medications before your next appointment, please call your pharmacy*   Lab Work:  BMET and Lipid panel today  If you have labs (blood work) drawn today and your tests are completely normal, you will receive your results only by: MyChart Message (if you have MyChart) OR A paper copy in the mail If you have any lab test that is abnormal or we need to change your treatment, we will call you to review the results.   Testing/Procedures:  None ordered   Follow-Up: At [facility], you and your health needs are our priority.  As part of our continuing mission to provide you with exceptional heart care, we have created designated Provider Care Teams.  These Care Teams include your primary Cardiologist (physician) and Advanced Practice Providers (APPs -  Physician Assistants and Nurse Practitioners) who all work together to provide you with the care you need, when you need it.  We recommend signing up for the patient portal called "MyChart".  Sign up information is provided on this After Visit Summary.  MyChart is used to connect with patients for Virtual Visits (Telemedicine).  Patients are able to view lab/test results, encounter notes, upcoming appointments, etc.  Non-urgent messages can be sent to your provider as well.   To learn more about what you can do with MyChart, go to [email or web address].    Your next appointment:   3 month(s)  The format for your next appointment:   In Person  Provider:   You may see Debbe Odea, MD or one of the following Advanced Practice Providers on your designated Care Team:   Nicolasa Ducking, NP Eula Listen, PA-C Cadence Fransico Michael, [place]

## 2021-10-14 LAB — BASIC METABOLIC PANEL
BUN/Creatinine Ratio: 10 (ref 10–24)
BUN: 14 mg/dL (ref 8–27)
CO2: 24 mmol/L (ref 20–29)
Calcium: 9 mg/dL (ref 8.6–10.2)
Chloride: 105 mmol/L (ref 96–106)
Creatinine, Ser: 1.38 mg/dL — ABNORMAL HIGH (ref 0.76–1.27)
Glucose: 99 mg/dL (ref 70–99)
Potassium: 4.4 mmol/L (ref 3.5–5.2)
Sodium: 141 mmol/L (ref 134–144)
eGFR: 58 mL/min/{1.73_m2} — ABNORMAL LOW (ref >59)

## 2021-10-14 LAB — LIPID PANEL
Chol/HDL Ratio: 2.8 ratio (ref 0.0–5.0)
Cholesterol, Total: 164 mg/dL (ref 100–199)
HDL: 58 mg/dL (ref >39)
LDL Chol Calc (NIH): 95 mg/dL (ref 0–99)
Triglycerides: 55 mg/dL (ref 0–149)
VLDL Cholesterol Cal: 11 mg/dL (ref 5–40)

## 2021-10-17 ENCOUNTER — Telehealth: Payer: Self-pay | Admitting: Emergency Medicine

## 2021-10-17 NOTE — Telephone Encounter (Signed)
Called to go over results and recommendations. No answer, lmtcb.

## 2021-10-17 NOTE — Telephone Encounter (Signed)
-----   Message from Cadence David Stall, PA-C sent at 10/17/2021  8:26 AM EST -----  ----- Message ----- From: Marianne Sofia, PA-C Sent: 10/17/2021   7:53 AM EST To: [facility] Triage  Labs overall look good.  LDL above goal 79, goal less than 70. Would like to start Lipitor 20mg  if this is OK with the patient.

## 2021-10-23 ENCOUNTER — Telehealth: Payer: Self-pay | Admitting: Medical

## 2021-10-23 NOTE — Telephone Encounter (Signed)
Cadence David Stall, PA-C  10/17/2021  7:53 AM EST     Labs overall look good.  LDL above goal 79, goal less than 70. Would like to start Lipitor 20mg  if this is OK with the patient.

## 2021-10-23 NOTE — Telephone Encounter (Signed)
The patient called back to the office today for his lab results.  I spoke with the patient and advised him of his lipid/ BMP results. He is aware of Cadence Furth, PA's recommendations to: 1) Start Lipitor 20 mg QD  The patient voices understanding of all results and recommendations. However, he would prefer to not start atorvastatin at this time. He is working on his diet, but has not been exercising.   I have advised the patient to please try to work on his cardiovascular exercise to try to help improve his LDL. His previous LDL was ~ 5 months ago and was 79. He confirms some dietary indiscretion over the holidays.   The patient stated he will try to increase his exercise. He is due to see Dr. Azucena Cecil in April and can re-visit this with him at that time.   He would like a copy of his lab work mailed to him and this has been done today.

## 2021-10-23 NOTE — Telephone Encounter (Signed)
Vergia Alberts, RN  10/17/2021  9:40 AM EST     lmtcb

## 2022-01-11 ENCOUNTER — Ambulatory Visit: Payer: BC Managed Care – PPO | Admitting: Cardiology

## 2022-07-06 ENCOUNTER — Ambulatory Visit: Payer: BC Managed Care – PPO | Admitting: Cardiology

## 2022-08-14 ENCOUNTER — Ambulatory Visit: Payer: BC Managed Care – PPO | Admitting: Cardiology

## 2023-04-07 IMAGING — DX DG CHEST 1V PORT
1 series · 1 of 1 positions shown · non-contrast
Comparison: 03/05/2014

CLINICAL DATA: Chest pain for 2 days

EXAM:
PORTABLE CHEST 1 VIEW

[chest ap]
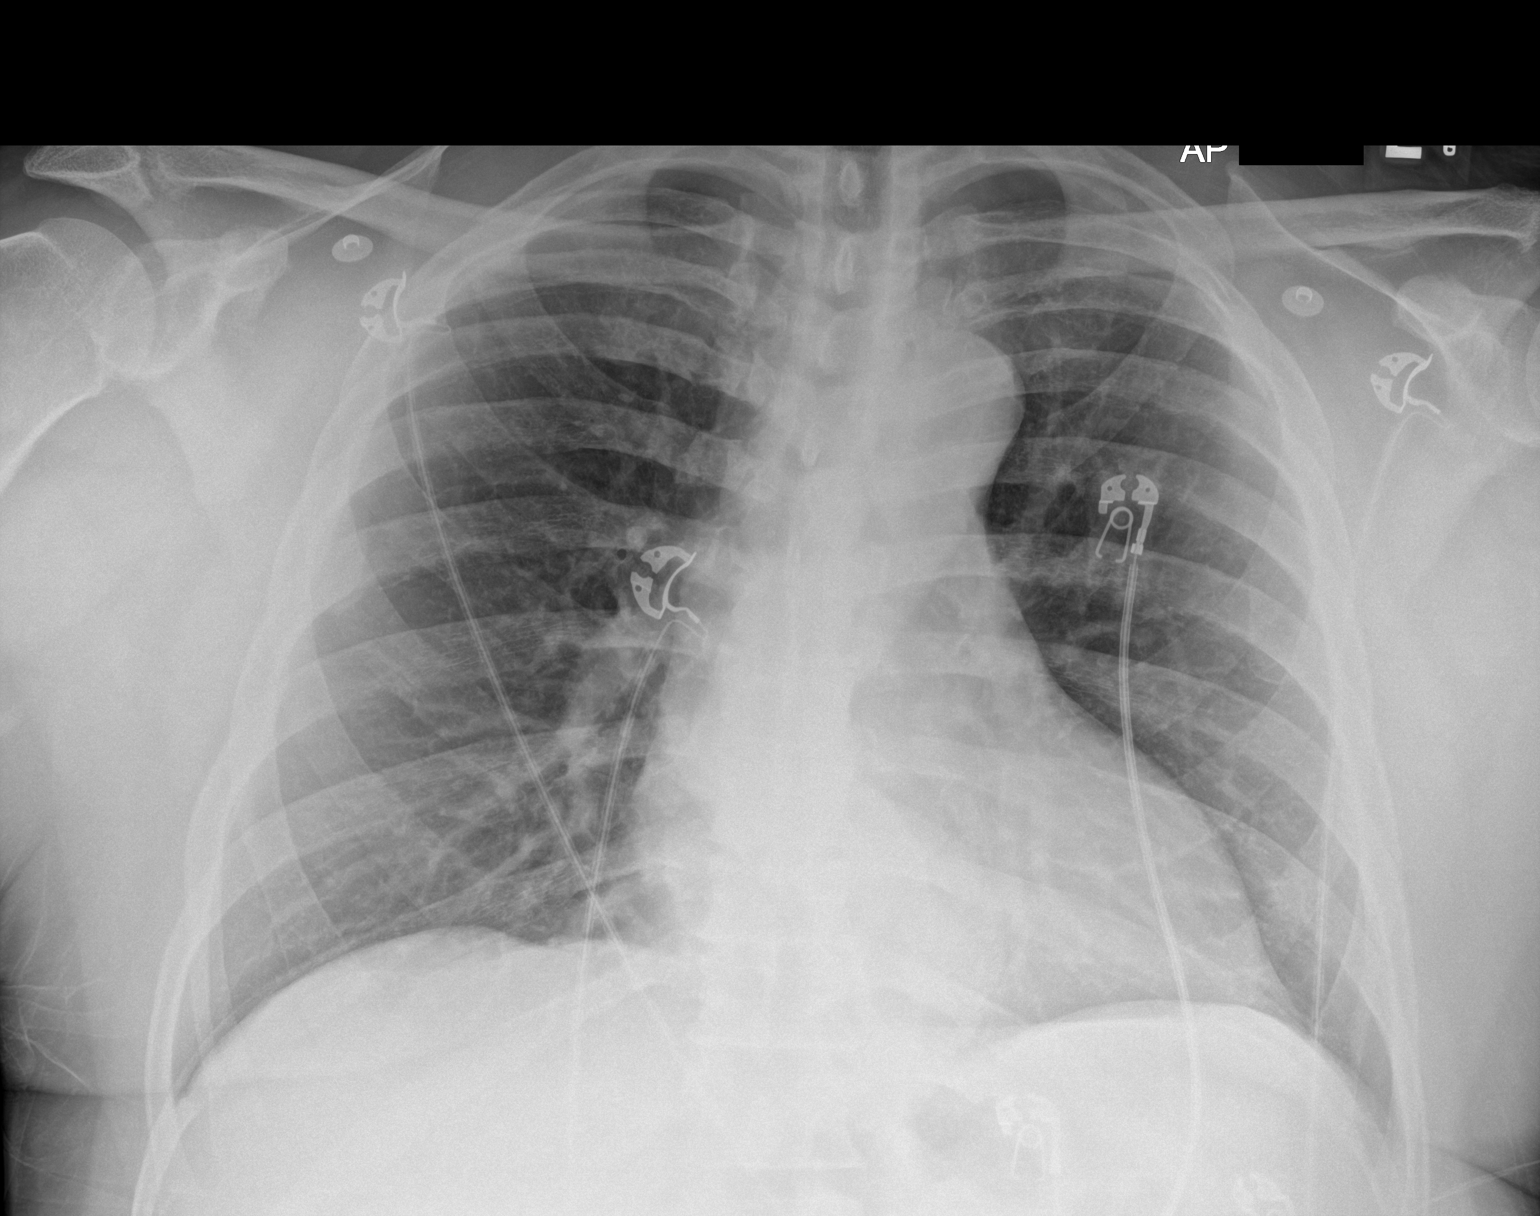

[1 of 1 positions shown; findings below may reference images not displayed]

FINDINGS: The heart size and mediastinal contours are within normal limits.
Tortuosity of the thoracic aorta. No focal airspace consolidation,
pleural effusion, or pneumothorax. Chronic left third rib fracture.
IMPRESSION: No active disease.

## 2023-05-26 IMAGING — CT CT HEART MORP W/ CTA COR W/ SCORE W/ CA W/CM &/OR W/O CM
1 of 15 series · 3 of 20 positions shown, 4 images · non-contrast
Comparison: None.

Addendum:
CLINICAL DATA: Chest pain

EXAM:
Cardiac/Coronary  CTA
TECHNIQUE: The patient was scanned on a Siemens Somatom go.Top scanner.

[Series 26: multiphase % cta coronary 0.60 · axial · 0.40mm/px · z∈[-1083,-1016]mm · 3 of 4008 slices shown, 4 images]
[im 1002/4008  vessel]
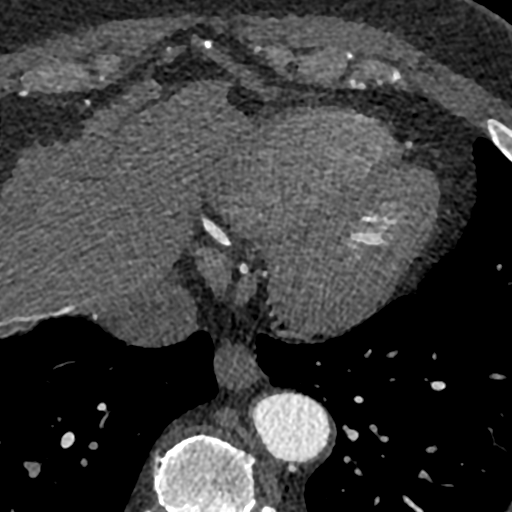
[im 1002/4008  lung]
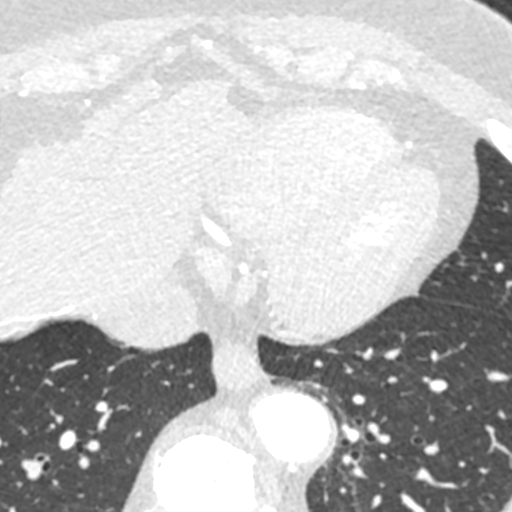
[im 2004/4008  vessel]
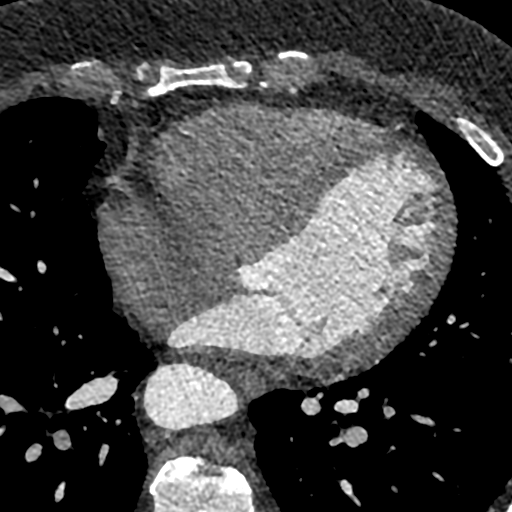
[im 3006/4008  vessel]
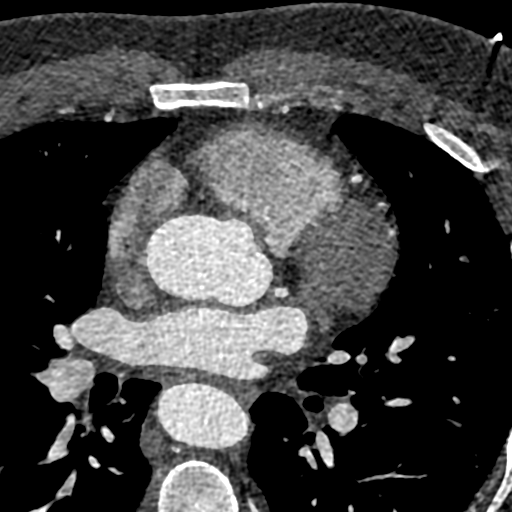

[3 of 20 positions shown; findings below may reference images not displayed]



Aortic Valve:  Trileaflet.  No calcifications.

Coronary Arteries:  Normal coronary origin.  Right dominance.

RCA is a large dominant artery that gives rise to PDA and PLA. There
is no plaque.

Left main is a large artery that gives rise to LAD and LCX arteries.

LAD has minimal proximal calcification with no obstruction (<25%).

LCX is a non-dominant artery that gives rise to three obtuse
marginal branches. There is no plaque.

Other findings:

Normal pulmonary vein drainage into the left atrium.

Normal left atrial appendage without a thrombus.

Normal size of the pulmonary artery.
IMPRESSION: 1. Minimal coronary calcium score of 35.7. Patient is low risk for
coronary events.

2. Normal coronary origin with right dominance.

3. Minimal LAD calcification.

4. CAD-RADS 1. Minimal non obstructive disease. Consider
non-atherosclerotic causes of chest pain.

EXAM:
OVER-READ INTERPRETATION  CT CHEST

The following report is an over-read performed by radiologist Dr.
over-read does not include interpretation of cardiac or coronary
anatomy or pathology. The coronary CTA interpretation by the
cardiologist is attached.
FINDINGS: No significant noncardiac vascular findings. Visualized mediastinum
and hilar regions demonstrate no lymphadenopathy or masses.
Visualized lungs show no evidence of pulmonary edema, consolidation,
pneumothorax, nodule or pleural fluid.
IMPRESSION: No significant incidental findings.



Aortic Valve:  Trileaflet.  No calcifications.

Coronary Arteries:  Normal coronary origin.  Right dominance.

RCA is a large dominant artery that gives rise to PDA and PLA. There
is no plaque.

Left main is a large artery that gives rise to LAD and LCX arteries.

LAD has minimal proximal calcification with no obstruction (<25%).

LCX is a non-dominant artery that gives rise to three obtuse
marginal branches. There is no plaque.

Other findings:

Normal pulmonary vein drainage into the left atrium.

Normal left atrial appendage without a thrombus.

Normal size of the pulmonary artery.
IMPRESSION: 1. Minimal coronary calcium score of 35.7. Patient is low risk for
coronary events.

2. Normal coronary origin with right dominance.

3. Minimal LAD calcification.

4. CAD-RADS 1. Minimal non obstructive disease. Consider
non-atherosclerotic causes of chest pain.
